# Patient Record
Sex: Male | Born: 1945
Health system: Southern US, Community
[De-identification: ages and names within clinical notes are randomized; demographics above are authoritative.]

## PROBLEM LIST (undated history)

## (undated) DIAGNOSIS — D649 Anemia, unspecified: Secondary | ICD-10-CM

## (undated) DIAGNOSIS — IMO0002 Reserved for concepts with insufficient information to code with codable children: Secondary | ICD-10-CM

## (undated) DIAGNOSIS — I4892 Unspecified atrial flutter: Secondary | ICD-10-CM

## (undated) DIAGNOSIS — I4891 Unspecified atrial fibrillation: Secondary | ICD-10-CM

## (undated) DIAGNOSIS — C259 Malignant neoplasm of pancreas, unspecified: Secondary | ICD-10-CM

## (undated) HISTORY — PX: PORTACATH PLACEMENT: SHX2246

## (undated) HISTORY — PX: BASAL CELL CARCINOMA EXCISION: SHX1214

## (undated) HISTORY — PX: ARM WOUND REPAIR / CLOSURE: SUR1141

## (undated) HISTORY — DX: Reserved for concepts with insufficient information to code with codable children: IMO0002

## (undated) HISTORY — DX: Unspecified atrial flutter: I48.92

## (undated) HISTORY — DX: Unspecified atrial fibrillation: I48.91

## (undated) HISTORY — PX: CERVICAL LAMINECTOMY: SHX94

---

## 2000-11-17 ENCOUNTER — Encounter: Payer: Self-pay | Admitting: Emergency Medicine

## 2000-11-17 ENCOUNTER — Emergency Department (HOSPITAL_COMMUNITY): Admission: AC | Admit: 2000-11-17 | Discharge: 2000-11-17 | Payer: Self-pay

## 2000-12-09 ENCOUNTER — Encounter: Payer: Self-pay | Admitting: Neurological Surgery

## 2000-12-09 ENCOUNTER — Ambulatory Visit (HOSPITAL_COMMUNITY): Admission: RE | Admit: 2000-12-09 | Discharge: 2000-12-09 | Payer: Self-pay | Admitting: Neurological Surgery

## 2005-07-14 ENCOUNTER — Ambulatory Visit: Payer: Self-pay | Admitting: Cardiology

## 2005-07-14 ENCOUNTER — Inpatient Hospital Stay (HOSPITAL_COMMUNITY): Admission: EM | Admit: 2005-07-14 | Discharge: 2005-07-15 | Payer: Self-pay | Admitting: Emergency Medicine

## 2005-07-15 ENCOUNTER — Encounter: Payer: Self-pay | Admitting: Cardiology

## 2005-07-22 ENCOUNTER — Ambulatory Visit: Payer: Self-pay | Admitting: Cardiology

## 2005-07-22 ENCOUNTER — Ambulatory Visit: Payer: Self-pay

## 2005-07-23 ENCOUNTER — Ambulatory Visit: Payer: Self-pay | Admitting: Internal Medicine

## 2005-08-18 HISTORY — PX: ABLATION: SHX5711

## 2005-08-27 ENCOUNTER — Ambulatory Visit: Payer: Self-pay | Admitting: Internal Medicine

## 2005-08-27 ENCOUNTER — Ambulatory Visit (HOSPITAL_COMMUNITY): Admission: RE | Admit: 2005-08-27 | Discharge: 2005-08-27 | Payer: Self-pay | Admitting: Internal Medicine

## 2005-10-12 ENCOUNTER — Ambulatory Visit: Payer: Self-pay | Admitting: Internal Medicine

## 2007-03-11 ENCOUNTER — Ambulatory Visit: Payer: Self-pay | Admitting: Cardiology

## 2007-03-11 ENCOUNTER — Ambulatory Visit (HOSPITAL_COMMUNITY): Admission: RE | Admit: 2007-03-11 | Discharge: 2007-03-11 | Payer: Self-pay | Admitting: Internal Medicine

## 2007-03-24 ENCOUNTER — Ambulatory Visit: Payer: Self-pay | Admitting: Internal Medicine

## 2009-04-19 ENCOUNTER — Ambulatory Visit: Payer: Self-pay | Admitting: Internal Medicine

## 2009-04-19 ENCOUNTER — Encounter: Payer: Self-pay | Admitting: Cardiology

## 2009-04-19 ENCOUNTER — Ambulatory Visit (HOSPITAL_COMMUNITY): Admission: RE | Admit: 2009-04-19 | Discharge: 2009-04-19 | Payer: Self-pay | Admitting: Cardiology

## 2009-04-29 ENCOUNTER — Telehealth: Payer: Self-pay | Admitting: Internal Medicine

## 2009-06-10 ENCOUNTER — Encounter: Admission: RE | Admit: 2009-06-10 | Discharge: 2009-07-09 | Payer: Self-pay | Admitting: Orthopedic Surgery

## 2009-06-11 ENCOUNTER — Telehealth (INDEPENDENT_AMBULATORY_CARE_PROVIDER_SITE_OTHER): Payer: Self-pay | Admitting: *Deleted

## 2010-02-18 ENCOUNTER — Telehealth (INDEPENDENT_AMBULATORY_CARE_PROVIDER_SITE_OTHER): Payer: Self-pay | Admitting: *Deleted

## 2010-04-25 ENCOUNTER — Ambulatory Visit (HOSPITAL_COMMUNITY)
Admission: RE | Admit: 2010-04-25 | Discharge: 2010-04-25 | Payer: Self-pay | Source: Home / Self Care | Attending: Cardiology | Admitting: Cardiology

## 2010-04-25 LAB — CBC
HCT: 46.1 % (ref 39.0–52.0)
Hemoglobin: 16.3 g/dL (ref 13.0–17.0)
MCH: 35.1 pg — ABNORMAL HIGH (ref 26.0–34.0)
MCHC: 35.4 g/dL (ref 30.0–36.0)
MCV: 99.1 fL (ref 78.0–100.0)
Platelets: 196 10*3/uL (ref 150–400)
RBC: 4.65 MIL/uL (ref 4.22–5.81)
RDW: 12.9 % (ref 11.5–15.5)
WBC: 10.2 10*3/uL (ref 4.0–10.5)

## 2010-04-25 LAB — BASIC METABOLIC PANEL
BUN: 9 mg/dL (ref 6–23)
CO2: 23 mEq/L (ref 19–32)
Calcium: 9.6 mg/dL (ref 8.4–10.5)
Chloride: 106 mEq/L (ref 96–112)
Creatinine, Ser: 1.41 mg/dL (ref 0.4–1.5)
GFR calc Af Amer: >60 mL/min (ref >60)
GFR calc non Af Amer: 50 mL/min — ABNORMAL LOW (ref >60)
Glucose, Bld: 106 mg/dL — ABNORMAL HIGH (ref 70–99)
Potassium: 5.2 mEq/L — ABNORMAL HIGH (ref 3.5–5.1)
Sodium: 138 mEq/L (ref 135–145)

## 2010-04-25 LAB — PROTIME-INR
INR: 0.89 (ref 0.00–1.49)
Prothrombin Time: 12.3 seconds (ref 11.6–15.2)

## 2010-04-25 LAB — APTT: aPTT: 27 seconds (ref 24–37)

## 2010-05-20 NOTE — Progress Notes (Signed)
   Phone Note Call from Patient   Caller: Patient Reason for Call: Talk to Doctor Details of Complaint: Prior atrial flutter Summary of Call: Dr. Andrey Campanile and I discussed treatment options for recurrent atrial flutter/fib.  He has had 3 episodes of atypical atrial flutter the past two years.  Most recently, he required DCCV.  He is symptomatic.  I have recommended Flecainide 200 mg when he experiences and episode of sustained atrial flutter.  Also, he will take an additional 25-50 mg of metoprolol if he goes out of rhythm.  Further, he is instructed not to drive or to do anything strenous once he has taken his flecainide.  A prescription has been sent to his pharmacy. Initial call taken by: Laren Boom, MD, [facility],  April 29, 2009 3:12 PM

## 2010-05-20 NOTE — Progress Notes (Signed)
   Recieved request for Records from [facility], forwarded to [facility] for processing [facility]  June 11, 2009 11:42 AM    Appended Document:  Recieved a 2nd request from [facility] forwarded to [facility] .Marland KitchenMarland KitchenKM

## 2010-05-20 NOTE — Progress Notes (Signed)
   Request received from [employer] sent to [facility]. [facility]  February 18, 2010 9:24 AM

## 2010-07-21 LAB — CBC
HCT: 45.8 % (ref 39.0–52.0)
Hemoglobin: 16.1 g/dL (ref 13.0–17.0)
MCHC: 35.1 g/dL (ref 30.0–36.0)
MCV: 102.3 fL — ABNORMAL HIGH (ref 78.0–100.0)
Platelets: 204 10*3/uL (ref 150–400)
RBC: 4.47 MIL/uL (ref 4.22–5.81)
RDW: 12.7 % (ref 11.5–15.5)
WBC: 8.6 10*3/uL (ref 4.0–10.5)

## 2010-07-21 LAB — BASIC METABOLIC PANEL
BUN: 14 mg/dL (ref 6–23)
CO2: 26 mEq/L (ref 19–32)
Calcium: 9.1 mg/dL (ref 8.4–10.5)
Chloride: 106 mEq/L (ref 96–112)
Creatinine, Ser: 1.09 mg/dL (ref 0.4–1.5)
GFR calc Af Amer: >60 mL/min (ref >60)
GFR calc non Af Amer: >60 mL/min (ref >60)
Glucose, Bld: 125 mg/dL — ABNORMAL HIGH (ref 70–99)
Potassium: 4.5 mEq/L (ref 3.5–5.1)
Sodium: 139 mEq/L (ref 135–145)

## 2010-07-21 LAB — PROTIME-INR
INR: 0.88 (ref 0.00–1.49)
Prothrombin Time: 11.9 seconds (ref 11.6–15.2)

## 2010-07-21 LAB — DIGOXIN LEVEL: Digoxin Level: 1 ng/mL (ref 0.8–2.0)

## 2010-09-02 NOTE — Consult Note (Signed)
NAME:  CHUKWUEMEKA, ARTOLA NO.:  [id number]   MEDICAL RECORD NO.:  [id number]          PATIENT TYPE:  OIB   LOCATION:  2899                         FACILITY:  [facility]   PHYSICIAN:  Rollene Rotunda, MD, FACCDATE OF BIRTH:  1946/02/10   DATE OF CONSULTATION:  03/11/2007  DATE OF DISCHARGE:                                 CONSULTATION   PRIMARY CARDIOLOGIST:  Doylene Canning. Ladona Ridgel, MD.   PRIMARY CARE Breindel Collier:  Eugenio Hoes. Tawanna Cooler, MD.   PATIENT PROFILE:  A 65 year old Caucasian male with prior history of  atrial flutter status post RF ablation in May 2007 who presents with  recurrent atrial tachycardia.   PROBLEM:  1. History of atrial flutter.      a.     March 2007 status post DC CV.      b.     History of normal 2-D echocardiogram and exercise Myoview.      c.     Aug 27, 2005 status post RF ablation by Dr. Ladona Ridgel.  2. Hyperlipidemia  3. History of cervical disk disease, status post laminectomy   HISTORY OF PRESENT ILLNESS:  A 65 year old Caucasian male with prior  history of atrial flutter status post cardioversion and subsequent  radiofrequency ablation in May 2007.  Approximately 1 month following  his ablation, he did note palpitations on 2 occasions requiring p.r.n.  Lopressor.  He otherwise has not had any recurrence of palpitations.  Unfortunately this morning he woke up sometime between 2:00 and 3:00  a.m. with tachy palpitations.  There was no chest pain, shortness of  breath, PND, orthopnea, dizziness, syncope.  He is an employee of [employer] and came in to work and continued to have tachy palpitations.  At  that point he contacted [facility] and following ECG the patient  was placed in short-stay for cardioversion.  Currently he denies any  chest pain or shortness of breath and continues to note tachy  palpitations.  His ECG has been reviewed by Dr. Antoine Poche and Dr. Graciela Husbands  and shows atrial tachycardia with a ventricular rate of 110 beats  per   minute.   ALLERGIES:  No known drug allergies.   HOME MEDICATIONS:  Lopressor 200 mg p.r.n. persistent tachy  palpitations, Lipitor 20 mg daily, aspirin 325 mg daily.   FAMILY HISTORY:  Positive for coronary artery disease in his sister, who  had a PCI.  His father also had an MI.   SOCIAL HISTORY:  He does not smoke but will occasionally have an  alcoholic beverage.   REVIEW OF SYSTEMS:  Positive for tachy palpitations.  Otherwise all  systems reviewed and negative.   PHYSICAL EXAMINATION:  GENERAL:  A pleasant white male in no acute  distress, awake, alert and oriented x3.  NECK:  No bruits, no JVD.  LUNGS:  Respirations are unlabored, clear to auscultation.  CARDIAC:  Regular tachycardic S1,S2.  No S3, S4 or murmurs.  ABDOMEN:  Round, soft, nontender, nondistended.  Bowel sounds present x 4.  EXTREMITIES:  Warm, dry, pink.  No clubbing, cyanosis or edema.  Dorsalis pedis, posterior tibial  pulses 2+ and equal bilaterally.   ACCESSORY CLINICAL FINDINGS:  CBC, BMET and TSH are pending, and EKG  shows atrial tachycardia at a rate of 110 beats per minute.  There are  no acute ST/T changes.   ASSESSMENT/PLAN:  1. Atrial tachycardia.  The patient presents with recurrent tachy      palpitations and ECG suggests atrial tachycardia.  He does have      history of A flutter status post ablation in 2007.  EKGs have been      reviewed by Dr. Graciela Husbands, who has recommended DC cardioversion.  The      patient has been brought over to short stay, and currently we are      awaiting lab work prior to pursuing cardioversion.  The patient had      previously been prescribed Toprol XL 25 mg daily; however, he has      not taken this in a long time as he had been doing well post      ablation.  He does have some p.r.n. Lopressor (200-mg tablets)      which again he has not needed to take in a long time.  Likely plan      to initiate daily beta blocker therapy.  Continue aspirin.  2. Hyperlipidemia.   Take Lipitor at home although he says he has not      taken this in a while.  Plan to continue.      Nicolasa Ducking, ANP      Rollene Rotunda, MD, [facility]  Electronically Signed    CB/MEDQ  D:  03/11/2007  T:  03/11/2007  Job:  [id number]

## 2010-09-02 NOTE — Assessment & Plan Note (Signed)
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  He is a very pleasant middle-  aged man with a history of atrial flutter (typical) who is status post  catheter ablation.  The patient developed recurrent atypical atrial  flutter approximately 2 weeks ago and ultimately because of the brief  duration underwent DC cardioversion.  He returns today for follow-up.  He has had no significant tachypalpitations since the cardioversion.  He  denies chest pain.  He denies shortness of breath.  He states that he  played tennis yesterday and had no trouble with this.  He is, however,  concerned about long-term ramifications and the development of  recurrence of atrial flutter.  The patient has a history of  dyslipidemia.  There may be a history of borderline hypertension.  He  has had documented atrial arrhythmias in the past including typical  atrial flutter back in March 2007, for which he underwent catheter  ablation of this.   PHYSICAL EXAM TODAY:  He is a pleasant, well-appearing middle-aged man  in no acute distress.  Blood pressure was 127/76 with a pulse of 72 and regular, respirations  were 18.  The weight was 217 pounds.  NECK:  No jugular venous distention.  LUNGS:  Clear bilaterally to auscultation.  No wheezes, rales or rhonchi  are present.  CARDIOVASCULAR:  A regular rate and rhythm, normal S1 and S2.  EXTREMITIES:  No edema.   No EKG was done today.   IMPRESSION:  1. Recurrent atypical atrial flutter.  2. History of typical flutter, status post ablation   DISCUSSION:  Overall, Dr. Andrey Campanile is stable.  His recurrent atrial  flutter does not appear to be typical flutter but with a different QRS  axis than previously.  We discussed the treatment  options today.  One  would be a period of watchful waiting and a plan to proceed with flutter  ablation if he develops recurrent flutter.  The second would be to  proceed with ablation of flutter.  After discussing the risks, benefits,  goals and expectations of the procedure, he would like to consider it  and will call us if and when he decides to proceed with ablation.     Doylene Canning. Ladona Ridgel, MD  Electronically Signed    GWT/MedQ  DD: 03/24/2007  DT: 03/25/2007  Job #: [id number]

## 2010-09-02 NOTE — Op Note (Signed)
NAME:  Jeremy Moody, Jeremy Moody NO.:  [id number]   MEDICAL RECORD NO.:  [id number]          PATIENT TYPE:  OIB   LOCATION:  2899                         FACILITY:  [facility]   PHYSICIAN:  Rollene Rotunda, MD, FACCDATE OF BIRTH:  03-Jun-1945   DATE OF PROCEDURE:  03/11/2007  DATE OF DISCHARGE:                               OPERATIVE REPORT   CARDIOLOGIST:  Duke Salvia, MD.   PROCEDURE:  Direct current cardioversion.   INDICATION:  Evaluate patient with atrial tachycardia with rapid  ventricular rate, symptomatic.   PROCEDURAL NOTE:  The patient was symptomatic with atrial tachycardia  that developed last night.  He was n.p.o. after midnight.  He had taken  some digoxin and had also taken his Lopressor, Lipitor and aspirin.  He  signed appropriate informed consent.  Anesthesia was administered by Dr.  Katrinka Blazing with 150 mg of Pentothal.  The patient was monitored and had face-  mask oxygen delivered.  He was hemodynamically stable and oxygenating  well throughout the procedure.  He was cardioverted x1 with 100 joules  of synchronized biphasic energy.  This resulted in sinus rhythm.  There  were no apparent complications.  EKG demonstrates sinus rhythm, rate 68,  axis within normal limits, intervals within normal limits, no acute ST-T  wave changes.   PLAN:  The patient will be seen in follow-up by Dr. Graciela Husbands or Dr. Ladona Ridgel  for further management.      Rollene Rotunda, MD, [facility]  Electronically Signed     JH/MEDQ  D:  03/11/2007  T:  03/12/2007  Job:  [id number]

## 2010-09-05 NOTE — H&P (Signed)
NAME:  Jeremy Moody, Jeremy Moody NO.:  [id number]   MEDICAL RECORD NO.:  [id number]          PATIENT TYPE:  OIB   LOCATION:  2899                         FACILITY:  [facility]   PHYSICIAN:  Jeremy Moody, M.D.  DATE OF BIRTH:  1945-06-12   DATE OF ADMISSION:  08/27/2005  DATE OF DISCHARGE:                                HISTORY & PHYSICAL   PRIMARY CARDIOLOGIST:  Dr. Lewayne Moody   REASON FOR ADMISSION:  Jeremy Moody is a 65 year old male recently  hospitalized here at [facility] (March 27 through March 28) with new onset  atrial flutter which was successfully cardioverted after application of 150  joules to normal sinus rhythm.  He presented with atrial flutter of less  than 48-hour duration and was thus discharged on full-dose aspirin rather  than Coumadin.  A 2-D echocardiogram was normal and electrolytes and TSH  level normal as well.   Following discharge patient followed up with Dr. Lewayne Moody and underwent  a repeat stress test (reportedly normal).  He had otherwise been doing well  since his recent discharge save for two episodes of recurrent palpitations,  the most recent occurring earlier this morning.   Patient awoke with palpitations early this morning and despite taking 100 mg  of Toprol and 0.25 mg of digoxin continued to have persistent palpitations.  He thus contacted Dr. Lewayne Moody and arrangements were made to proceed  with same day radiofrequency catheter ablation.   On presentation patient is hemodynamically stable and asymptomatic.  Electrocardiogram reveals atrial flutter (2:1) at 130 BPM.   ALLERGIES:  No known drug allergies.   HOME MEDICATIONS:  1.  Coated aspirin 325 mg daily.  2.  Toprol XL 25 mg daily.  3.  Lipitor 20 mg daily.   PAST MEDICAL HISTORY:  1.  Atrial flutter.      1.  Status post successful DC cardioversion to normal sinus rhythm July 15, 2005.  2.  Normal left ventricular function.      1.  2-D echocardiogram  March 2007.  3.  History of negative exercise stress test.  4.  Hyperlipidemia.  5.  Status post cervical neck surgery.   SOCIAL HISTORY:  Patient denies smoking tobacco.  Drinks alcohol on  occasion.   FAMILY HISTORY:  Father had history of myocardial infarction.  Sister with  known coronary artery disease status post percutaneous intervention.   REVIEW OF SYSTEMS:  Negative for hypertension, diabetes mellitus.  Denies  any exertional chest discomfort or dyspnea.  Denies any evidence of overt  bleeding.  Remaining systems negative.   PHYSICAL EXAMINATION:  VITAL SIGNS:  Blood pressure 116/78, pulse 129,  regular, respirations 18, temperature 97.4.  GENERAL:  65 year old male sitting upright in no apparent distress.  HEENT:  Normocephalic, atraumatic.  NECK:  Preserved bilateral carotid pulses without bruits.  No JVD.  LUNGS:  Clear to auscultation all fields.  HEART:  Regular rhythm with increased rate (S1, S2).  No significant  murmurs.  ABDOMEN:  Soft, nontender with intact bowel sounds.  No bruits.  EXTREMITIES:  Palpable bilateral femoral pulses without bruits; palpable  dorsalis pedis/posterior tibialis pulses without edema.  NEUROLOGIC:  No focal deficits.   Electrocardiogram:  Atrial flutter (2:1) at 130 BPM with left axis  deviation; no acute changes.   LABORATORY DATA:  Pending.  WBC 7.8, hemoglobin 16.9, hematocrit 48 (MCV  100.6), platelets 221.   IMPRESSION:  1.  Recurrent atrial flutter with rapid ventricular response.      1.  Status post successful DC cardioversion to normal sinus rhythm March          2007.  2.  Normal left ventricular function.  3.  Hyperlipidemia.   PLAN:  Proceed with radiofrequency catheter ablation per Dr. Lewayne Moody.      Jeremy Moody, P.A. [facility]    ______________________________  Jeremy Moody, M.D.    GS/MEDQ  D:  08/27/2005  T:  08/27/2005  Job:  [id number]   cc:   Jeremy Moody, M.D. [facility]  [address] [place]  [place] [address]

## 2010-09-05 NOTE — Op Note (Signed)
NAME:  Jeremy Moody, Jeremy Moody NO.:  [id number]   MEDICAL RECORD NO.:  [id number]          PATIENT TYPE:  OIB   LOCATION:  2001                         FACILITY:  [facility]   PHYSICIAN:  Doylene Canning. Ladona Ridgel, M.D.  DATE OF BIRTH:  Dec 14, 1945   DATE OF PROCEDURE:  08/27/2005  DATE OF DISCHARGE:  08/27/2005                                 OPERATIVE REPORT   PROCEDURE PERFORMED:  Electrophysiologic study and catheter ablation of  atrial flutter.   INTRODUCTION:  The patient is a very pleasant 65 year old male with a  history of recurrent atrial flutter initially diagnosed back in March when  he presented to hospital with tachy palpitations and was found to be in  atrial flutter at rates of 150 beats a minute.  He also underwent a DC  cardioversion and aspirin therapy along with beta blockers.  The patient did  well for approximately 6 weeks but then developed recurrent atrial flutter  and is here day for electrophysiologic study and catheter ablation.  Of  note, the patient's atrial flutter has been present for less than 12 hours  having awakened him from sleep. He is certain that he did not have flutter  yesterday.   DESCRIPTION OF PROCEDURE:  After informed consent was obtained, the patient  was taken to the diagnostic EP lab in the fasted state.  After the usual  preparation and draping, intravenous fentanyl and midazolam that was given  for sedation.  A 6-French hexapolar catheter was inserted percutaneously in  the right jugular vein advanced to the coronary sinus.  A 7-French 20 Polar  Halo catheter was inserted percutaneously in the right femoral vein and  advanced to the right atrium.  A 5-French quadripolar catheter was inserted  percutaneously in the right femoral vein and advanced to the  His bundle  region.  After measuring the basic intervals, mapping was carried out  demonstrating typical counterclockwise tricuspid annular reentrant and  atrial flutter.  The 7-French  quadripolar ablation catheter was inserted  percutaneously through the right femoral vein and advanced to the right  atrium.  Mapping was carried out.  The atrial flutter isthmus was normal  size and orientation.  During the second RF energy application, atrial  flutter was terminated and sinus rhythm was restored.  The next RF energy  application resulted in the creation of bidirectional isthmus block.  A  bonus RF energy application was then delivered and the patient was observed  for 25 minutes.  During this time, rapid ventricular pacing was carried out  from the RV apex demonstrating a VA Wenckebach cycle length of greater than  or equal to 600 msec. A programmed ventricular stimulation was carried out  and also demonstrated VA dissociation at 600 msec.  Rapid atrial pacing was  carried out at pacing cycle length of 490 msec, stepwise decreased down to  300 msec where AV Wenckebach was observed.  Programmed atrial stimulation  was carried out from the coronary sinus as well as the high right atrium  with a __________ cycle length of 500 msec. The S1, S2 interval was stepwise  decreased down to 240 msec where AV node ERP was observed.  After the  patient was observed for approximately 25 minutes, isthmus conduction  promptly returned.  The 7-French quadripolar ablation catheter was then  reinserted into the atrial flutter isthmus.  A large area of atrial  myocardium was found on the anterior portion of the atrial flutter isthmus  just anterior to the coronary sinus ostium.  A total of seven additional RF  energy applications were delivered in this region resulting in the creation  of isthmus block.  The patient was then again observed for 15 minutes and  had no recurrent atrial flutter or isthmus conduction.  The catheter was  then removed, hemostasis was assured and the patient was returned to his  room in satisfactory condition.   COMPLICATIONS:  There were no immediate procedure  complications.   RESULTS:  A.  Baseline ECG:  Baseline ECG demonstrates atrial flutter with  2:1 AV conduction and an atrial flutter cycle length of 240 msec.  B.  Baseline intervals:  The HV interval was 53 msec. The atrial flutter  cycle length was 240 msec, the QRS duration 71 msec.  C.  Rapid ventricular pacing:  Rapid ventricular pacing following catheter  ablation demonstrated VA dissociation at 600 msec.  D.  Programmed ventricular stimulation:  Programmed ventricular stimulation  was carried out following restoration of sinus rhythm and demonstrated VA  dissociation at 600 msec.  E.  Rapid atrial pacing.  Rapid atrial pacing was carried out from the high  right atrium and coronary sinus at pacing cycle length of 600 msec and  stepwise decreased down to 300 msec where AV Wenckebach was observed.  During rapid atrial pacing following ablation, there was no inducible  arrhythmia.  F.  Programmed atrial stimulation:  Programmed atrial stimulation was  carried out from the coronary sinus at a pacing cycle length of 500 msec  with the S1, S2 interval stepwise decreased down to 240 msec where the AV  node ERP was observed.  During programmed atrial stimulation, there were no  age jumps and echo beats.  G.  Arrhythmias observed:  1.  Atrial flutter initiation present at the time of EP study.  Duration was      sustained, termination was catheter ablation cycle length of 240 msec.      1.  Mapping:  Mapping of the atrial flutter demonstrated typical          counterclockwise tricuspid annular reentrant atrial flutter.          1.  RF energy application:  A total of 18 RF energy applications              were delivered.  During the third RF energy application, sinus              rhythm was restored and isthmus block was demonstrated.  Eight              bonus RF energy applications were delivered.  There was no             recurrent isthmus conduction for 25 minutes but then the patient               developed recurrent atrial flutter isthmus conduction.  Seven              additional RF energy applications were delivered to the anterior              portion of  the atrial flutter isthmus resulting in demonstration              of bidirectional block and atrial flutter isthmus.   CONCLUSION:  This study demonstrates successful electrophysiologic study and  RF catheter ablation of typical atrial flutter with a total of 18 RF energy  applications some of which were very short delivered to the usual atrial  flutter isthmus resulting in termination of atrial flutter, restoration of  sinus rhythm, a creation of bidirectional block and atrial flutter isthmus.           ______________________________  Doylene Canning. Ladona Ridgel, M.D.     GWT/MEDQ  D:  08/27/2005  T:  08/28/2005  Job:  [id number]   cc:   Olga Millers, M.D. [facility]  [address]  [address]  [place]  [place] [address]   Eugenio Hoes. Tawanna Cooler, M.D. [facility]  [address] [place]  [place] [address]

## 2010-09-05 NOTE — Discharge Summary (Signed)
NAME:  Jeremy Moody, Jeremy Moody NO.:  [id number]   MEDICAL RECORD NO.:  [id number]          PATIENT TYPE:  OIB   LOCATION:  2001                         FACILITY:  [facility]   PHYSICIAN:  Doylene Canning. Ladona Ridgel, M.D.  DATE OF BIRTH:  03/01/1946   DATE OF ADMISSION:  08/27/2005  DATE OF DISCHARGE:  08/27/2005                                 DISCHARGE SUMMARY   PRIMARY CARE PHYSICIAN:  Tinnie Gens A. Tawanna Cooler, M.D. [facility].   PRIMARY CARDIOLOGIST:  Doylene Canning. Ladona Ridgel, M.D.   PRINCIPAL DIAGNOSIS:  Atrial flutter.   OTHER DIAGNOSES:  1.  Hypertension.  2.  Hyperlipidemia.   ALLERGIES:  No known drug allergies.   PROCEDURE:  Atrial flutter radiofrequency ablation.   HISTORY OF PRESENT ILLNESS:  A 65 year old white male with a prior history  of atrial flutter, first diagnosed in March 2007.  At that time he underwent  successful DCCV and had a normal 2D echocardiogram with normal followup  exercise Myoview.  He was in his usual state of health until this morning,  when he awoke and had recurrent palpitations similar to previous episodes of  atrial flutter.  He contacted Dr. Ladona Ridgel and arrangements were made for  admission with same day radiofrequency ablation.   HOSPITAL COURSE:  Jeremy Moody was taken to the electrophysiology lab where he  underwent successful radiofrequency ablation.  He tolerated this procedure  well and post procedure has been ambulating without any access site  issues/bleeding or recurrence in atrial flutter.  He is being discharged  home this evening in satisfactory condition.   DISCHARGE LABORATORY:  Hemoglobin 16.9, hematocrit 48, WBC 7.8, platelets  221.   DISPOSITION:  The patient is being discharged home today in good condition.   FOLLOWUP PLANS AND APPOINTMENTS:  He will have a followup appointment with  Dr. Lewayne Bunting in about three to four weeks.   DISCHARGE MEDICATIONS:  1.  Toprol XL 25 mg every day.  2.  Lipitor 20 mg every day.  3.  Aspirin 325 mg every  day.   OUTSTANDING LABORATORY/STUDIES:  None.   Duration of discharge encounter:  Thirty five minutes.      Ok Anis, NP    ______________________________  Doylene Canning. Ladona Ridgel, M.D.    CRB/MEDQ  D:  08/27/2005  T:  08/28/2005  Job:  [id number]   cc:   Tinnie Gens A. Tawanna Cooler, M.D. [facility]  [address] [place]  [place] [address]

## 2010-09-05 NOTE — Discharge Summary (Signed)
NAME:  Jeremy Moody, Jeremy Moody NO.:  [id number]   MEDICAL RECORD NO.:  [id number]          PATIENT TYPE:  INP   LOCATION:  2002                         FACILITY:  [facility]   PHYSICIAN:  Olga Millers, M.D. LHCDATE OF BIRTH:  04-11-1946   DATE OF ADMISSION:  07/14/2005  DATE OF DISCHARGE:  07/15/2005                                 DISCHARGE SUMMARY   PRINCIPAL DIAGNOSIS:  1.  Atrial flutter.  2.  Hyperlipidemia.   ALLERGIES:  No known drug allergies.   PROCEDURES:  1.  Two-dimensional echocardiogram.  2.  Synchronized cardioversion.   HISTORY OF PRESENT ILLNESS:  [age]-year-old white male with prior history of  hyperlipidemia, who otherwise has no prior cardiac history other than  occasional palpitations with negative evaluation in the past including an  exercise Myoview.  Typically, he does not have dyspnea on exertion,  orthopnea, PND, pedal edema or exertional chest pain.  He was at the  [employer] [facility] [employer] Conference in [place] on the morning of  July 14, 2005, when he had onset of palpitations at 5 a.m.  without  associated symptoms.  He took a total of 300 mg of Lopressor over the course  of the day while he traveled back to [place].  He then drove himself  from [place], [place] to the [facility] ED for evaluation.  In the  ED, he was noted to be in atrial flutter and he was admitted for further  evaluation.   HOSPITAL COURSE:  He was given a diltiazem bolus followed by an infusion of  5 mg an hour.  His pressure did drop into the 80s systolically with the  diltiazem bolus, requiring some fluid hydration.  His cardiac markers were  negative x3 and his electrolytes were normal.  TSH was also normal.  Because  he was in flutter for less than 48 hours, decision was made to pursue  synchronized cardioversion on the morning of July 15, 2005 and this was  successful in restoring sinus rhythm.  He will be discharged on Toprol-XL 25  mg daily as well as aspirin.  There is not a need for Coumadin at this time.  He underwent 2-D echocardiogram this afternoon and at the time of this  dictation, those results are pending.  He has a followup appointment  scheduled with Dr. Lewayne Bunting on July 22, 2005 and is also scheduled for  followup exercise Myoview on July 22, 2005.   DISCHARGE LABORATORY DATA:  Hemoglobin 14.8, hematocrit 42.7, WBC 10.8,  platelets 192,000, MCV 101.9.  Sodium 140, potassium 4.1, chloride 110, CO2  24, BUN 12, creatinine 1.1, glucose 101, total bilirubin 1.0, alkaline  phosphatase 58, AST 31, ALT 28, albumin 4.2.  Cardiac markers negative x3.  Calcium 9.4.  TSH 2.317.   DISPOSITION:  The patient is being discharged home today in good condition.   FOLLOWUP PLANS AND APPOINTMENTS:  He is to follow up with Dr. Lewayne Bunting  on July 23, 2005 at 4:45 p.m.  Prior to that, he will undergo exercise  Myoview on July 22, 2005 at 9:15 a.m.  He will also have a B12 and folate  checked as an outpatient, given his elevated MCV.   DISCHARGE MEDICATIONS:  1.  Aspirin 325 mg daily.  2.  Toprol-XL 25 mg daily.  3.  Lipitor 20 mg daily.   OUTSTANDING LABORATORY STUDIES:  None.   DURATION OF DISCHARGE ENCOUNTER:  Sixty minutes including physician time.      Ok Anis, NP    ______________________________  Olga Millers, M.D. [facility]    CRB/MEDQ  D:  07/15/2005  T:  07/17/2005  Job:  [id number]   cc:   Doylene Canning. Ladona Ridgel, M.D.  [address]  [address]  [place]  [place] [address]

## 2010-09-05 NOTE — H&P (Signed)
NAME:  RILEY, HALLUM NO.:  [id number]   MEDICAL RECORD NO.:  [id number]          PATIENT TYPE:  EMS   LOCATION:  MAJO                         FACILITY:  [facility]   PHYSICIAN:  Olga Millers, M.D. 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  He has no prior cardiac  history of palpitations.  He did have an evaluation with a Holter monitor  previously that was unremarkable by his report and a negative nuclear study  approximately eight years ago.  He typically does not have dyspnea on  exertion, orthopnea, PND, pedal edema, presyncope, syncope, or exertional  chest pain.  He has had occasional palpitations in the past.  He was at the  heart meetings in [place] and awoke this morning at approximately 5 a.m.  with palpitations.  There was no associated chest pain or shortness of  breath.  He took 100 mg of Lopressor x3 over the course of the day but his  symptoms persisted.  He now presents to the emergency room and is found to  be in atrial flutter.  He has no known drug allergies.   HOME MEDICATIONS:  1.  Aspirin daily.  2.  Motrin p.r.n. neck pain.  3.  Lipitor 20 mg p.o. daily.   SOCIAL HISTORY:  He does not smoke but he does occasionally consume alcohol  (approximately one to two alcoholic beverages per evening).   FAMILY HISTORY:  Positive for coronary artery disease in his sister who had  a recent PCI.  He also states that his father had a myocardial infarction.   PAST MEDICAL HISTORY:  There is no diabetes mellitus nor hypertension but  there is a history of hyperlipidemia.  He has had prior cervical disk  surgery.   REVIEW OF SYSTEMS:  He denies any headaches or fevers or chills.  There is  no productive cough or hemoptysis.  There is no dysphagia  or odynophagia,  melena, or hematochezia.  There is no dysuria or hematuria.  There is no  rash or seizure activity.  There is no orthopnea, PND, or pedal edema.  The  remaining systems are negative.   PHYSICAL EXAMINATION:  VITAL SIGNS:  Blood pressure 106/70 and his pulse is  130.  GENERAL:  He is well-developed and well-nourished in no acute distress.  He  does not appear to be depressed.  SKIN:  Warm and dry.  PERIPHERAL:  No clubbing.  HEENT:  Unremarkable with normal eyelids.  NECK:  Supple with normal upstroke bilaterally.  There are no bruits noted.  There is no jugular venous distention and I cannot appreciate thyromegaly.  CHEST:  Clear to auscultation.  Normal expansion.  CARDIOVASCULAR:  Tachycardic rate, but a regular rhythm.  There are no  murmurs, rubs, or gallops noted.  ABDOMEN:  Nontender.  Standard positive bowel sounds.  No  hepatosplenomegaly.  No mass appreciated.  There  is no abdominal bruit.  He  has 2+ femoral pulses bilaterally.  No bruits.  EXTREMITIES:  No edema.  I can palpate no cords.  He has 2+ dorsalis pedis  pulses bilaterally.  NEUROLOGIC:  Grossly intact.   His electrocardiogram shows atrial flutter with a rapid ventricular response  at 132.  There is an RV conduction delay.  There are no significant ST  changes noted.  Remaining laboratories are pending.   DIAGNOSES:  1.  Atrial flutter with a rapid ventricular response.  2.  Hyperlipidemia.   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  We will admit to telemetry and cycle enzymes, although I doubt  ischemia.  We will check a TSH as well as an echocardiogram to quantify his  left ventricular function.  I will begin intravenous heparin as well as IV  Cardizem for rate control.  If he does not convert overnight then we will  plan to cardiovert tomorrow morning (the duration of his atrial flutter is  less than 48 hours).  He will then need an EP evaluation  for possible  flutter ablation.  He will not need Coumadin long-term as he does not have  embolic risk factors (he does not have a history of diabetes mellitus,  hypertension, prior CVA, or history of reduced LV function).  We will plan  an outpatient stress Myoview after his atrial flutter is treated.           ______________________________  Olga Millers, M.D. [facility]     BC/MEDQ  D:  07/14/2005  T:  07/15/2005  Job:  [id number]

## 2010-09-05 NOTE — Op Note (Signed)
NAME:  Jeremy Moody, Jeremy Moody NO.:  [id number]   MEDICAL RECORD NO.:  [id number]          PATIENT TYPE:  INP   LOCATION:  2002                         FACILITY:  [facility]   PHYSICIAN:  Olga Millers, M.D. LHCDATE OF BIRTH:  [birth year]   DATE OF PROCEDURE:  DATE OF DISCHARGE:                                 OPERATIVE REPORT   PROCEDURE PERFORMED:  Cardioversion of atrial flutter.   The patient was sedated with 250 mg of Pentothal intravenously.  Synchronized cardioversion with 150 joules (biphasic) resulted in normal  sinus rhythm.  There were no immediate complications.           ______________________________  Olga Millers, M.D. [facility]     BC/MEDQ  D:  07/15/2005  T:  07/16/2005  Job:  [id number]

## 2011-01-27 LAB — APTT: aPTT: 27

## 2011-01-27 LAB — CBC
HCT: 49.6
Hemoglobin: 17.2 — ABNORMAL HIGH
MCHC: 34.7
MCV: 100.5 — ABNORMAL HIGH
Platelets: 272
RBC: 4.93
RDW: 13.2
WBC: 10.1

## 2011-01-27 LAB — BASIC METABOLIC PANEL
BUN: 13
CO2: 27
Calcium: 9.3
Chloride: 105
Creatinine, Ser: 0.96
GFR calc Af Amer: >60
GFR calc non Af Amer: >60
Glucose, Bld: 110 — ABNORMAL HIGH
Potassium: 4.3
Sodium: 139

## 2011-01-27 LAB — TSH: TSH: 1.634

## 2011-01-27 LAB — PROTIME-INR
INR: 0.9
Prothrombin Time: 12.1

## 2011-07-01 ENCOUNTER — Other Ambulatory Visit (HOSPITAL_COMMUNITY): Payer: Self-pay

## 2011-07-01 ENCOUNTER — Other Ambulatory Visit: Payer: Self-pay

## 2011-07-07 ENCOUNTER — Other Ambulatory Visit: Payer: Self-pay | Admitting: *Deleted

## 2011-07-07 DIAGNOSIS — R002 Palpitations: Secondary | ICD-10-CM

## 2011-07-29 ENCOUNTER — Other Ambulatory Visit: Payer: Self-pay

## 2011-07-29 NOTE — Telephone Encounter (Signed)
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 Pt stated he tried to do refill himself in system and it failed it would not allow him to complete process. I took phone message and I would have message entered to staff for refills and would contact him back at office. I called to see what pharmacy he would like refill to go to & also to get approval from Dr. Ladona Ridgel. Pt said he saw Dr. Ladona Ridgel and that he told him it was not a problem to get his refills. Pt has not been seen in office for a office visit, Pt has a order in system for a 48 hr monitor, pt has not completed as well.All visit have been in hospital services not the office. Will forward information to Dr. Ladona Ridgel to see what he would like to to at this point.

## 2011-07-30 ENCOUNTER — Other Ambulatory Visit: Payer: Self-pay

## 2011-07-30 MED ORDER — FLECAINIDE ACETATE 100 MG PO TABS: 100.0000 mg | ORAL_TABLET | Freq: Two times a day (BID) | ORAL | Status: DC

## 2011-08-31 ENCOUNTER — Other Ambulatory Visit: Payer: Self-pay | Admitting: *Deleted

## 2011-08-31 MED ORDER — METOPROLOL TARTRATE 25 MG PO TABS: ORAL_TABLET | ORAL | Status: DC

## 2012-03-03 ENCOUNTER — Other Ambulatory Visit: Payer: Self-pay | Admitting: Internal Medicine

## 2012-03-03 MED ORDER — METOPROLOL TARTRATE 25 MG PO TABS: ORAL_TABLET | ORAL | Status: DC

## 2012-03-03 NOTE — Telephone Encounter (Signed)
New problem:   New rx for crestor. C/o lipitor is given him joint pain.

## 2012-03-03 NOTE — Telephone Encounter (Signed)
Called the Metoprolol in to the pharmacy, will have to check with Dr Ladona Ridgel in regards to the Crestor

## 2012-04-06 ENCOUNTER — Other Ambulatory Visit: Payer: Self-pay | Admitting: Cardiology

## 2012-04-06 MED ORDER — ROSUVASTATIN CALCIUM 10 MG PO TABS: 10.0000 mg | ORAL_TABLET | Freq: Every day | ORAL | Status: DC

## 2012-04-06 NOTE — Telephone Encounter (Signed)
Ok to fill rosuvastatin (CRESTOR) 10 MG tablet -- take 1 tablet by mouth qd QTY: 30 with 2 refills per Dr. Cassell Clement.  Caralee Ates, CMA

## 2012-08-12 ENCOUNTER — Other Ambulatory Visit: Payer: Self-pay | Admitting: Internal Medicine

## 2012-08-12 ENCOUNTER — Telehealth: Payer: Self-pay | Admitting: *Deleted

## 2012-08-12 NOTE — Telephone Encounter (Addendum)
Dr Ladona Ridgel spoke with patient and spoke with pharmacy.  His medication was called in and appointment was made for the end of May

## 2012-08-12 NOTE — Telephone Encounter (Signed)
Pt states he has seen Dr. Ladona Ridgel. And he needs flecainide refilled. I see know recent documents of this except in EMR some Opt notes back in 2010..etc...will route this to Dr. Lubertha Basque nurse for review. [facility] pharmacy

## 2012-09-15 ENCOUNTER — Ambulatory Visit (INDEPENDENT_AMBULATORY_CARE_PROVIDER_SITE_OTHER): Payer: 59 | Admitting: Internal Medicine

## 2012-09-15 ENCOUNTER — Encounter: Payer: Self-pay | Admitting: Internal Medicine

## 2012-09-15 VITALS — BP 128/76 | HR 59 | Ht 71.0 in | Wt 221.6 lb

## 2012-09-15 DIAGNOSIS — I4891 Unspecified atrial fibrillation: Secondary | ICD-10-CM | POA: Insufficient documentation

## 2012-09-15 NOTE — Patient Instructions (Signed)
Your physician wants you to follow-up in: 2 years with Dr. Taylor.  You will receive a reminder letter in the mail two months in advance. If you don't receive a letter, please call our office to schedule the follow-up appointment.  

## 2012-09-15 NOTE — Assessment & Plan Note (Signed)
He is maintaining sinus rhythm on very low-dose flecainide therapy in conjunction with low-dose beta blocker therapy. He will continue his current medical therapy. I've asked the patient to keep a low caffeine and low alcohol intake diet.

## 2012-09-15 NOTE — Progress Notes (Signed)
HPI Jeremy Moody returns today for followup. He is a pleasant 67 yo man with a h/o atrial flutter s/p catheter ablation with a h/o recurrent atrial fibrillation. In the interim he has been stable on low dose flecainide. He denies chest pain, sob, or peripheral edema. Minimal palpitations unless he misses a couple of days of flecainide.   No Known Allergies   Current Outpatient Prescriptions  Medication Sig Dispense Refill  . aspirin 325 MG tablet Take 325 mg by mouth daily.      . fish oil-omega-3 fatty acids 1000 MG capsule Take 1 g by mouth daily.      . flecainide (TAMBOCOR) 100 MG tablet       . metoprolol tartrate (LOPRESSOR) 25 MG tablet Take 25 mg by mouth daily. Take one by mouth twice daily      . Multiple Vitamin (MULTIVITAMIN) tablet Take 1 tablet by mouth daily.      . rosuvastatin (CRESTOR) 10 MG tablet Take 1 tablet (10 mg total) by mouth daily.  30 tablet  2   No current facility-administered medications for this visit.     History reviewed. No pertinent past medical history.  ROS:   All systems reviewed and negative except as noted in the HPI.   History reviewed. No pertinent past surgical history.   No family history on file.   History   Social History  . Marital Status: Married    Spouse Name: N/A    Number of Children: N/A  . Years of Education: N/A   Occupational History  . Not on file.   Social History Main Topics  . Smoking status: Never Smoker   . Smokeless tobacco: Not on file  . Alcohol Use: 3 - 4 oz/week    6-8 drink(s) per week  . Drug Use: No  . Sexually Active: Not on file   Other Topics Concern  . Not on file   Social History Narrative  . No narrative on file     BP 128/76  Pulse 59  Ht 5\' 11"  (1.803 m)  Wt 221 lb 9.6 oz (100.517 kg)  BMI 30.92 kg/m2  Physical Exam:  Well appearing NAD HEENT: Unremarkable Neck:  No JVD, no thyromegally Lymphatics:  No adenopathy Back:  No CVA tenderness Lungs:  Clear with no wheezes,  rales, or rhonchi.  HEART:  Regular rate rhythm, no murmurs, no rubs, no clicks Abd:  soft, positive bowel sounds, no organomegally, no rebound, no guarding Ext:  2 plus pulses, no edema, no cyanosis, no clubbing Skin:  No rashes no nodules Neuro:  CN II through XII intact, motor grossly intact  EKG NSR with borderline LVH  Assess/Plan:

## 2012-10-03 ENCOUNTER — Other Ambulatory Visit: Payer: Self-pay | Admitting: Internal Medicine

## 2013-02-08 ENCOUNTER — Other Ambulatory Visit: Payer: Self-pay | Admitting: Internal Medicine

## 2013-09-15 ENCOUNTER — Ambulatory Visit (AMBULATORY_SURGERY_CENTER): Payer: Self-pay | Admitting: *Deleted

## 2013-09-15 VITALS — Ht 71.0 in | Wt 227.4 lb

## 2013-09-15 DIAGNOSIS — Z1211 Encounter for screening for malignant neoplasm of colon: Secondary | ICD-10-CM

## 2013-09-15 MED ORDER — PREPOPIK 10-3.5-12 MG-GM-GM PO PACK: PACK | ORAL | Status: DC

## 2013-09-15 NOTE — Progress Notes (Signed)
Patient denies any allergies to eggs or soy. Patient denies any problems with anesthesia/sedation. Patient denies any oxygen use at home and does not take any diet/weight loss medications. EMMI education assisgned to patient on colonoscopy, this was explained and instructions given to patient. 

## 2013-09-25 ENCOUNTER — Encounter: Payer: Self-pay | Admitting: Gastroenterology

## 2013-09-25 ENCOUNTER — Ambulatory Visit (AMBULATORY_SURGERY_CENTER): Payer: 59 | Admitting: Gastroenterology

## 2013-09-25 VITALS — BP 121/55 | HR 56 | Temp 97.5°F | Resp 20 | Ht 71.0 in | Wt 227.0 lb

## 2013-09-25 DIAGNOSIS — D126 Benign neoplasm of colon, unspecified: Secondary | ICD-10-CM

## 2013-09-25 DIAGNOSIS — Z1211 Encounter for screening for malignant neoplasm of colon: Secondary | ICD-10-CM

## 2013-09-25 MED ORDER — SODIUM CHLORIDE 0.9 % IV SOLN: 500.0000 mL | INTRAVENOUS | Status: DC

## 2013-09-25 NOTE — Progress Notes (Signed)
Report to PACU, RN, vss, BBS= Clear.  

## 2013-09-25 NOTE — Op Note (Signed)
[place]  [address] [place], [address]   COLONOSCOPY PROCEDURE REPORT  PATIENT: Jeremy Moody, Jeremy Moody  MR#: [id number] BIRTHDATE: 1946/02/26 , 67  yrs. old GENDER: Male ENDOSCOPIST: Ladene Artist, MD, [facility] REFERRED AX:[id number] Delora Fuel, M.D. PROCEDURE DATE:  09/25/2013 PROCEDURE:   Colonoscopy with biopsy First Screening Colonoscopy - Avg.  risk and is 50 yrs.  old or older Yes.  Prior Negative Screening - Now for repeat screening. N/A  History of Adenoma - Now for follow-up colonoscopy & has been > or = to 3 yrs.  N/A  Polyps Removed Today? Yes. ASA CLASS:   Class II INDICATIONS:average risk screening. MEDICATIONS: MAC sedation, administered by CRNA and propofol (Diprivan) 280mg  IV DESCRIPTION OF PROCEDURE:   After the risks benefits and alternatives of the procedure were thoroughly explained, informed consent was obtained.  A digital rectal exam revealed no abnormalities of the rectum.   The [id number]  endoscope was introduced through the anus and advanced to the cecum, which was identified by both the appendix and ileocecal valve. No adverse events experienced.   The quality of the prep was Prepopik good The instrument was then slowly withdrawn as the colon was fully examined.  COLON FINDINGS: A sessile polyp measuring 4 mm in size was found in the transverse colon.  A polypectomy was performed with cold forceps.  The resection was complete and the polyp tissue was completely retrieved.   The colon was otherwise normal.  There was no diverticulosis, inflammation, polyps or cancers unless previously stated.  Retroflexed views revealed no abnormalities. The time to cecum=2 minutes 11 seconds.  Withdrawal time=16 minutes 06 seconds.  The scope was withdrawn and the procedure completed.  COMPLICATIONS: There were no complications.  ENDOSCOPIC IMPRESSION: 1.   Sessile polyp measuring 4 mm in the transverse colon; polypectomy performed with cold  forceps 2.   The colon was otherwise normal  RECOMMENDATIONS: 1.  Await pathology results 2.  Repeat colonoscopy in 5 years if polyp adenomatous; otherwise 10 years  eSigned:  Ladene Artist, MD, [facility] 09/25/2013 9:05 AM

## 2013-09-25 NOTE — Patient Instructions (Signed)
YOU HAD AN ENDOSCOPIC PROCEDURE TODAY AT THE [facility]: Refer to the procedure report that was given to you for any specific questions about what was found during the examination.  If the procedure report does not answer your questions, please call your gastroenterologist to clarify.  If you requested that your care partner not be given the details of your procedure findings, then the procedure report has been included in a sealed envelope for you to review at your convenience later.  YOU SHOULD EXPECT: Some feelings of bloating in the abdomen. Passage of more gas than usual.  Walking can help get rid of the air that was put into your GI tract during the procedure and reduce the bloating. If you had a lower endoscopy (such as a colonoscopy or flexible sigmoidoscopy) you may notice spotting of blood in your stool or on the toilet paper. If you underwent a bowel prep for your procedure, then you may not have a normal bowel movement for a few days.  DIET: Your first meal following the procedure should be a light meal and then it is ok to progress to your normal diet.  A half-sandwich or bowl of soup is an example of a good first meal.  Heavy or fried foods are harder to digest and may make you feel nauseous or bloated.  Likewise meals heavy in dairy and vegetables can cause extra gas to form and this can also increase the bloating.  Drink plenty of fluids but you should avoid alcoholic beverages for 24 hours.  ACTIVITY: Your care partner should take you home directly after the procedure.  You should plan to take it easy, moving slowly for the rest of the day.  You can resume normal activity the day after the procedure however you should NOT DRIVE or use heavy machinery for 24 hours (because of the sedation medicines used during the test).    SYMPTOMS TO REPORT IMMEDIATELY: A gastroenterologist can be reached at any hour.  During normal business hours, 8:30 AM to 5:00 PM Monday through Friday,  call [phone].  After hours and on weekends, please call the GI answering service at [phone] who will take a message and have the physician on call contact you.   Following lower endoscopy (colonoscopy or flexible sigmoidoscopy):  Excessive amounts of blood in the stool  Significant tenderness or worsening of abdominal pains  Swelling of the abdomen that is new, acute  Fever of 100F or higher    FOLLOW UP: If any biopsies were taken you will be contacted by phone or by letter within the next 1-3 weeks.  Call your gastroenterologist if you have not heard about the biopsies in 3 weeks.  Our staff will call the home number listed on your records the next business day following your procedure to check on you and address any questions or concerns that you may have at that time regarding the information given to you following your procedure. This is a courtesy call and so if there is no answer at the home number and we have not heard from you through the emergency physician on call, we will assume that you have returned to your regular daily activities without incident.  SIGNATURES/CONFIDENTIALITY: You and/or your care partner have signed paperwork which will be entered into your electronic medical record.  These signatures attest to the fact that that the information above on your After Visit Summary has been reviewed and is understood.  Full responsibility of the confidentiality   of this discharge information lies with you and/or your care-partner.     

## 2013-09-25 NOTE — Progress Notes (Signed)
Called to room to assist during endoscopic procedure.  Patient ID and intended procedure confirmed with present staff. Received instructions for my participation in the procedure from the performing physician.  

## 2013-09-26 ENCOUNTER — Telehealth: Payer: Self-pay | Admitting: *Deleted

## 2013-09-26 NOTE — Telephone Encounter (Signed)
Voicemail not set up unable to leave message attempted to contact pt. x2.

## 2013-09-28 ENCOUNTER — Encounter: Payer: Self-pay | Admitting: Gastroenterology

## 2013-12-05 ENCOUNTER — Other Ambulatory Visit: Payer: Self-pay | Admitting: Internal Medicine

## 2013-12-06 NOTE — Telephone Encounter (Signed)
Should this be qd or bid? Please advise. Thanks, MI 

## 2013-12-07 NOTE — Telephone Encounter (Signed)
Medication list has it as bid and no changes were made at last office visit

## 2014-02-26 ENCOUNTER — Other Ambulatory Visit: Payer: Self-pay | Admitting: Internal Medicine

## 2014-06-19 ENCOUNTER — Other Ambulatory Visit: Payer: Self-pay | Admitting: Dermatology

## 2014-07-10 ENCOUNTER — Other Ambulatory Visit: Payer: Self-pay | Admitting: Dermatology

## 2014-08-29 ENCOUNTER — Other Ambulatory Visit: Payer: Self-pay | Admitting: *Deleted

## 2014-08-29 MED ORDER — DOXYCYCLINE HYCLATE 100 MG PO TABS: 100.0000 mg | ORAL_TABLET | Freq: Two times a day (BID) | ORAL | Status: DC

## 2014-08-29 NOTE — Telephone Encounter (Signed)
Doxy 100 mg 1 twice daily #20 with 1 refill per Dr Sherren Mocha

## 2015-02-21 ENCOUNTER — Other Ambulatory Visit: Payer: Self-pay | Admitting: Internal Medicine

## 2015-04-25 ENCOUNTER — Other Ambulatory Visit: Payer: Self-pay | Admitting: Family Medicine

## 2015-04-25 DIAGNOSIS — R634 Abnormal weight loss: Secondary | ICD-10-CM

## 2015-04-25 DIAGNOSIS — R5382 Chronic fatigue, unspecified: Secondary | ICD-10-CM

## 2015-04-25 DIAGNOSIS — R109 Unspecified abdominal pain: Secondary | ICD-10-CM

## 2015-04-29 ENCOUNTER — Other Ambulatory Visit (HOSPITAL_COMMUNITY)
Admission: RE | Admit: 2015-04-29 | Discharge: 2015-04-29 | Disposition: A | Discharge status: Home / Self Care | Payer: 59 | Source: Ambulatory Visit | Attending: Family Medicine | Admitting: Family Medicine

## 2015-04-29 ENCOUNTER — Ambulatory Visit (HOSPITAL_COMMUNITY)
Admission: RE | Admit: 2015-04-29 | Discharge: 2015-04-29 | Disposition: A | Discharge status: Home / Self Care | Payer: 59 | Source: Ambulatory Visit | Attending: Family Medicine | Admitting: Family Medicine

## 2015-04-29 ENCOUNTER — Encounter (HOSPITAL_COMMUNITY): Payer: Self-pay

## 2015-04-29 DIAGNOSIS — R590 Localized enlarged lymph nodes: Secondary | ICD-10-CM | POA: Diagnosis not present

## 2015-04-29 DIAGNOSIS — R918 Other nonspecific abnormal finding of lung field: Secondary | ICD-10-CM | POA: Insufficient documentation

## 2015-04-29 DIAGNOSIS — K869 Disease of pancreas, unspecified: Secondary | ICD-10-CM | POA: Insufficient documentation

## 2015-04-29 DIAGNOSIS — R160 Hepatomegaly, not elsewhere classified: Secondary | ICD-10-CM | POA: Insufficient documentation

## 2015-04-29 DIAGNOSIS — R109 Unspecified abdominal pain: Secondary | ICD-10-CM | POA: Insufficient documentation

## 2015-04-29 DIAGNOSIS — R5382 Chronic fatigue, unspecified: Secondary | ICD-10-CM | POA: Diagnosis not present

## 2015-04-29 DIAGNOSIS — I10 Essential (primary) hypertension: Secondary | ICD-10-CM | POA: Diagnosis present

## 2015-04-29 DIAGNOSIS — R634 Abnormal weight loss: Secondary | ICD-10-CM | POA: Insufficient documentation

## 2015-04-29 LAB — URINALYSIS, ROUTINE W REFLEX MICROSCOPIC
Glucose, UA: NEGATIVE mg/dL
HGB URINE DIPSTICK: NEGATIVE
KETONES UR: 15 mg/dL — AB
NITRITE: POSITIVE — AB
PH: 5.5 (ref 5.0–8.0)
Protein, ur: 30 mg/dL — AB
Specific Gravity, Urine: 1.028 (ref 1.005–1.030)

## 2015-04-29 LAB — CBC WITH DIFFERENTIAL/PLATELET
BASOS ABS: 0 10*3/uL (ref 0.0–0.1)
BASOS PCT: 0 %
EOS ABS: 0.2 10*3/uL (ref 0.0–0.7)
Eosinophils Relative: 1 %
HEMATOCRIT: 39 % (ref 39.0–52.0)
Hemoglobin: 13 g/dL (ref 13.0–17.0)
LYMPHS ABS: 2.9 10*3/uL (ref 0.7–4.0)
Lymphocytes Relative: 14 %
MCH: 33 pg (ref 26.0–34.0)
MCHC: 33.3 g/dL (ref 30.0–36.0)
MCV: 99 fL (ref 78.0–100.0)
Monocytes Absolute: 1.6 10*3/uL — ABNORMAL HIGH (ref 0.1–1.0)
Monocytes Relative: 8 %
NEUTROS ABS: 15.9 10*3/uL — AB (ref 1.7–7.7)
NEUTROS PCT: 77 %
Platelets: 271 10*3/uL (ref 150–400)
RBC: 3.94 MIL/uL — ABNORMAL LOW (ref 4.22–5.81)
RDW: 13.5 % (ref 11.5–15.5)
WBC: 20.6 10*3/uL — ABNORMAL HIGH (ref 4.0–10.5)

## 2015-04-29 LAB — URINE MICROSCOPIC-ADD ON

## 2015-04-29 LAB — COMPREHENSIVE METABOLIC PANEL
ALT: 66 U/L — AB (ref 17–63)
ANION GAP: 11 (ref 5–15)
AST: 76 U/L — ABNORMAL HIGH (ref 15–41)
Albumin: 2.5 g/dL — ABNORMAL LOW (ref 3.5–5.0)
Alkaline Phosphatase: 378 U/L — ABNORMAL HIGH (ref 38–126)
BUN: 17 mg/dL (ref 6–20)
CALCIUM: 8.9 mg/dL (ref 8.9–10.3)
CHLORIDE: 103 mmol/L (ref 101–111)
CO2: 22 mmol/L (ref 22–32)
CREATININE: 1.02 mg/dL (ref 0.61–1.24)
Glucose, Bld: 97 mg/dL (ref 65–99)
Potassium: 4.8 mmol/L (ref 3.5–5.1)
SODIUM: 136 mmol/L (ref 135–145)
Total Bilirubin: 1.6 mg/dL — ABNORMAL HIGH (ref 0.3–1.2)
Total Protein: 7.2 g/dL (ref 6.5–8.1)

## 2015-04-29 LAB — AMYLASE: AMYLASE: 88 U/L (ref 28–100)

## 2015-04-29 LAB — GAMMA GT: GGT: 607 U/L — AB (ref 7–50)

## 2015-04-29 LAB — LIPASE, BLOOD: LIPASE: 39 U/L (ref 11–51)

## 2015-04-29 MED ORDER — IOHEXOL 300 MG/ML  SOLN
100.0000 mL | Freq: Once | INTRAMUSCULAR | Status: AC | PRN
  Administered 2015-04-29: 100 mL via INTRAVENOUS

## 2015-04-30 ENCOUNTER — Encounter: Payer: Self-pay | Admitting: *Deleted

## 2015-04-30 ENCOUNTER — Ambulatory Visit (HOSPITAL_BASED_OUTPATIENT_CLINIC_OR_DEPARTMENT_OTHER): Payer: 59 | Admitting: Oncology

## 2015-04-30 ENCOUNTER — Other Ambulatory Visit: Payer: Self-pay | Admitting: Radiology

## 2015-04-30 ENCOUNTER — Telehealth: Payer: Self-pay | Admitting: *Deleted

## 2015-04-30 VITALS — BP 122/58 | HR 87 | Temp 98.0°F | Resp 18 | Ht 71.0 in | Wt 198.1 lb

## 2015-04-30 DIAGNOSIS — K769 Liver disease, unspecified: Secondary | ICD-10-CM | POA: Diagnosis not present

## 2015-04-30 DIAGNOSIS — R109 Unspecified abdominal pain: Secondary | ICD-10-CM | POA: Diagnosis not present

## 2015-04-30 DIAGNOSIS — K869 Disease of pancreas, unspecified: Secondary | ICD-10-CM

## 2015-04-30 DIAGNOSIS — C787 Secondary malignant neoplasm of liver and intrahepatic bile duct: Principal | ICD-10-CM

## 2015-04-30 DIAGNOSIS — M549 Dorsalgia, unspecified: Secondary | ICD-10-CM

## 2015-04-30 DIAGNOSIS — C259 Malignant neoplasm of pancreas, unspecified: Secondary | ICD-10-CM

## 2015-04-30 DIAGNOSIS — K8689 Other specified diseases of pancreas: Secondary | ICD-10-CM

## 2015-04-30 DIAGNOSIS — R630 Anorexia: Secondary | ICD-10-CM

## 2015-04-30 DIAGNOSIS — R197 Diarrhea, unspecified: Secondary | ICD-10-CM

## 2015-04-30 DIAGNOSIS — R634 Abnormal weight loss: Secondary | ICD-10-CM

## 2015-04-30 MED ORDER — PANCRELIPASE (LIP-PROT-AMYL) 36000-114000 UNITS PO CPEP: 72000.0000 [IU] | ORAL_CAPSULE | Freq: Three times a day (TID) | ORAL | Status: DC

## 2015-04-30 MED ORDER — HYDROCODONE-ACETAMINOPHEN 5-325 MG PO TABS: 1.0000 | ORAL_TABLET | ORAL | Status: DC | PRN

## 2015-04-30 MED FILL — CREON DR 36,000 UNITS CAP: 36000 | 30 days supply | Qty: 180 | Fill #0

## 2015-04-30 NOTE — Telephone Encounter (Signed)
Oncology Nurse Navigator Documentation  Oncology Nurse Navigator Flowsheets 04/30/2015  Navigator Location [facility]  Navigator Encounter Type Introductory phone call  Abnormal Finding Date 04/29/2015  Per Dr. Benay Spice, called patient with appointment to see Dr. Benay Spice today at 3:30 as new patient. He agrees to time.

## 2015-04-30 NOTE — Progress Notes (Signed)
[facility] New Patient Consult   Referring MD: Dorena Cookey, [address], [place] [address]   Jeremy Moody 70 y.o.  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 He reports developing increased back pain several months ago which she attributed to chronic disc disease. He saw Dr. Ellene Route in December and had an epidural steroid injection. This did not help the pain. He reports reflux symptoms at Christmas that improved with Nexium. For the past few weeks she has developed cramping abdominal pain, diarrhea, and dark urine. He has associated anorexia. He contacted Dr. Sherren Mocha and was referred for a CT of the abdomen and pelvis yesterday.  The CT revealed small irregular pulmonary nodules at the lung bases, innumerable liver masses, and an infiltrative hypoenhancing mass in the uncinate 8 process of the pancreas. The mass encases the superior mesenteric artery and 50% of the superior mesenteric vein. Normal caliber small bowel with no small bowel wall thickening. Celiac, porta hepatis, portacaval, aortocaval, and left periaortic lymphadenopathy was noted. No ascites. Degenerative changes were noted at the thoracic or lumbar spine. No aggressive-appearing focal osseous lesions.    Past Medical History  Diagnosis Date  . Degenerative disc disease   . Atrial fibrillation and flutter ([place])     .  History of a right spermatocele  Past Surgical History  Procedure Laterality Date  . Ablation  08/2005    for A-flutter  . Cervical laminectomy      x3  . Arm wound repair / closure    . Basal cell carcinoma excision      Medications: Reviewed  Allergies: No Known Allergies  Family history: His mother died of lung cancer at age 79 and was a smoker. No other family history of cancer.  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 He has a history of moderate alcohol use. No transfusion history.  History  Alcohol Use  . 3.0 - 4.0 oz/week  . 6-8 drink(s) per week    History  Smoking status  . Never Smoker   Smokeless tobacco  . Never Used      ROS:   Positives include: Anorexia, 30 pound weight loss, nausea, diarrhea 2-3 times per day partially improved with Imodium Thomas dark urine, mild exertional dyspnea, abdominal pain and tenderness, back pain  A complete ROS was otherwise negative.  Physical Exam:  Blood pressure 122/58, pulse 87, temperature 98 F (36.7 C), temperature source Oral, resp. rate 18, height 5\' 11"  (1.803 m), weight 198 lb 1.6 oz (89.858 kg), SpO2 100 %.  HEENT: Oropharynx without visible mass, sclera anicteric, neck without mass Lungs: Clear bilaterally Cardiac: Regular rate and rhythm Abdomen: The liver edge is palpable throughout the right and mid abdomen with associated tenderness. No apparent ascites. No splenomegaly. GU: Testes without mass, right spermatocele  Vascular: No leg edema Lymph nodes: No cervical, supra-clavicular, axillary, or inguinal nodes Neurologic: Alert and oriented, the motor exam appears intact in the upper and lower extremities Skin: No rash Musculoskeletal: No spine tenderness   LAB:  CBC  Lab Results  Component Value Date   WBC 20.6* 04/29/2015   HGB 13.0 04/29/2015   HCT 39.0 04/29/2015   MCV 99.0 04/29/2015   PLT 271 04/29/2015   NEUTROABS 15.9* 04/29/2015     CMP  Component Value Date/Time   NA 136 04/29/2015 0954   K 4.8 04/29/2015 0954   CL 103 04/29/2015 0954   CO2 22 04/29/2015 0954   GLUCOSE 97 04/29/2015 0954   BUN 17 04/29/2015 0954   CREATININE 1.02 04/29/2015 0954   CALCIUM 8.9 04/29/2015 0954   PROT 7.2 04/29/2015 0954   ALBUMIN 2.5* 04/29/2015 0954   AST 76* 04/29/2015 0954   ALT 66* 04/29/2015 0954   ALKPHOS 378* 04/29/2015 0954   BILITOT 1.6* 04/29/2015 0954   GFRNONAA >60 04/29/2015 0954   GFRAA  >60 04/29/2015 0954      Imaging:  Ct Abdomen Pelvis W Contrast  04/29/2015  CLINICAL DATA:  70 year old male with fatigue, 35 pound weight loss and abdominal pain over the last 6 months. EXAM: CT ABDOMEN AND PELVIS WITH CONTRAST TECHNIQUE: Multidetector CT imaging of the abdomen and pelvis was performed using the standard protocol following bolus administration of intravenous contrast. CONTRAST:  145mL OMNIPAQUE IOHEXOL 300 MG/ML  SOLN COMPARISON:  None. FINDINGS: Lower chest: There are innumerable small irregular pulmonary nodules at both lung bases (greater than 30), largest 1.0 cm in the basilar right lower lobe (series 3/image 16). There is a mildly enlarged 0.9 cm right pericardiophrenic lymph node (series 2/ image 6). Hepatobiliary: There are innumerable (> than 25) hypodense confluent liver masses of various sizes scattered throughout the liver, with representative liver masses as follows: - segment 2 left liver lobe 3.9 x 3.8 cm mass (series 2/ image 17) - segment 7 right liver lobe 6.6 x 5.2 cm mass (series 2/ image 23) - segments 4a/8 5.2 x 3.9 cm liver mass (series 2/image 18) Normal gallbladder with no radiopaque cholelithiasis. No intrahepatic biliary ductal dilatation. Minimally dilated common bile duct (7 mm diameter). Pancreas: There is an infiltrative hypoenhancing 5.4 x 2.9 x 4.8 cm pancreatic mass centered in the uncinate process of the pancreas (series 2/ image 33 and series 5/ image 71). This pancreatic mass encases at least 80% of the SMA circumference. This pancreatic mass encases approximately 50% of the SMV circumference, with associated high-grade narrowing of the SMV (series 5/ image 66). No definite main portal vein encasement. Spleen: Normal size. No mass. Adrenals/Urinary Tract: Normal adrenals. Simple 2.5 cm medial upper right renal cyst. Otherwise normal kidneys, with no hydronephrosis. Normal bladder. Stomach/Bowel: Grossly normal stomach. Normal caliber small bowel with no  small bowel wall thickening. Normal diminutive appendix. Normal large bowel with no diverticulosis, large bowel wall thickening or pericolonic fat stranding. Vascular/Lymphatic: Atherosclerotic nonaneurysmal abdominal aorta. Splenic and portal veins remain patent. Patent renal veins. There is mass-effect on the hepatic veins by surrounding liver masses, and the hepatic veins appear to remain patent. There is para celiac, porta hepatis, portacaval, aortocaval and left para-aortic lymphadenopathy. For example, there are enlarged 2.4 cm porta hepatis (series 2/ image 24), 2.4 cm portacaval (series 2/image 20) and 1.7 cm left periaortic (series 2/ image 37) lymph nodes. There a few scattered mildly enlarged mesenteric lymph nodes, largest 1.0 cm in the central left mesentery (series 2/image 46). There is an enlarged 1.7 cm right common iliac node (series 2/image 63). There is an enlarged 1.2 cm left common iliac node (series 2/image 57). There is an enlarged 1.5 cm right external iliac lymph node (series 2/image 60). Reproductive: Normal size prostate and seminal vesicles, with nonspecific internal prostatic calcifications. Other: No pneumoperitoneum, ascites or focal fluid collection. There is fat stranding and ill-defined fluid throughout the anterior perinephric retroperitoneal space and bilateral pelvic  anterior extraperitoneal space. Musculoskeletal: No aggressive appearing focal osseous lesions. Moderate to marked degenerative changes throughout the thoracolumbar spine. Small to moderate left and small right fat containing inguinal hernias. IMPRESSION: 1. Infiltrative hypoenhancing 5.4 cm pancreatic mass centered in the uncinate process, most in keeping with the primary pancreatic adenocarcinoma, with local vascular encasement as described. 2. Extensive retroperitoneal, mesenteric, pelvic and lower mediastinal lymphadenopathy, in keeping with nodal metastatic disease. 3. Innumerable hypodense liver masses, in  keeping with widespread liver metastases. 4. Innumerable small pulmonary nodules at both lung bases, in keeping with widespread pulmonary metastases. 5. Nonspecific fat stranding and ill-defined fluid throughout the bilateral anterior retroperitoneum and anterior pelvic extraperitoneal space, cannot exclude superimposed acute pancreatitis or tumor related hemorrhage. No focal drainable fluid collection. These results were called by telephone at the time of interpretation on 04/29/2015 at 4:50 pm to Dr. Dellis Filbert TODD , who verbally acknowledged these results. Electronically Signed   By: Ilona Sorrel M.D.   On: 04/29/2015 16:58   CT images reviewed   Assessment/Plan:   1. Pancreas mass  CT abdomen/pelvis 04/29/2015 revealed a pancreas uncinate mass, liver metastases, lung nodules, extensive retroperitoneal/mesenteric, pelvic, and low mediastinal lymphadenopathy 2. Anorexia/weight loss  3.   Abdominal/back pain secondary to the pancreas mass and liver metastases  4.    History of atrial fibrillation and flutter  5.    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 A CT of the abdomen on 04/29/2015 is consistent with a diagnosis of metastatic pancreas cancer. I discussed the probable diagnosis and treatment options with him. He agrees to an ultrasound-guided biopsy of a liver lesion to confirm a diagnosis of pancreas cancer. He most likely has metastatic adenocarcinoma of the pancreas, but the differential diagnosis includes a pancreas neuroendocrine tumor or Islet cell tumor.  He understands no therapy will be curative if a diagnosis of pancreas cancer is confirmed. We will consider systemic chemotherapy options. My initial impression is to recommend gemcitabine/Abraxane chemotherapy. His performance status is borderline to receive FOLFIRINOX. He indicated that he would like to avoid toxicity from  chemotherapy as possible.  He will begin a trial of pancreatic enzyme replacement. We refilled a prescription for hydrocodone.  Dr. Redmond Pulling will be referred for an ultrasound-guided biopsy of a liver lesion and Port-A-Cath placement.  He will return for an office visit 05/03/2015.  Approximately 50 minutes were spent with the patient today. The majority of the time was used for counseling and coordination of care.   [place], [place] 04/30/2015, 5:26 PM

## 2015-04-30 NOTE — Progress Notes (Signed)
Oncology Nurse Navigator Documentation  Oncology Nurse Navigator Flowsheets 04/30/2015  Navigator Location [facility]  Navigator Encounter Type Initial MedOnc  Abnormal Finding Date 04/29/2015  Patient Visit Type MedOnc;Initial  Treatment Phase Treatment planning  Barriers/Navigation Needs Family concerns;Education  Education Understanding Cancer/ Treatment Options;Pain/ Symptom Management;Newly Diagnosed Cancer Education;Preparing for Upcoming Biopsy/PAC/ Treatment  Interventions Referrals  Referrals Nutrition/dietician  Support Groups/Services GI Support Group  Acuity Level 1  Time Spent with Patient 60  Met with patient and his wife, Charlett Nose during new patient visit. Explained the role of the GI Nurse Navigator and provided New Patient Packet with information on: 1. Pancreatic cancer 2. Support groups 3. Advanced Directives 4. Fall Safety Plan Answered questions, reviewed current treatment plan using TEACH back and provided emotional support. Provided copy of current treatment plan. Provided samples of different supplements of Boost, Ensure Enlive, Ensure Breeze to try before he purchases these. Will inquire with lab at [facility] if CA 19-9 can be added to his labs drawn there on 04/29/15.   Merceda Elks, RN, BSN GI Oncology [place]

## 2015-05-01 ENCOUNTER — Telehealth: Payer: Self-pay | Admitting: *Deleted

## 2015-05-01 ENCOUNTER — Other Ambulatory Visit: Payer: Self-pay | Admitting: Radiology

## 2015-05-01 ENCOUNTER — Telehealth (HOSPITAL_COMMUNITY): Payer: Self-pay | Admitting: *Deleted

## 2015-05-01 ENCOUNTER — Other Ambulatory Visit: Payer: Self-pay | Admitting: *Deleted

## 2015-05-01 ENCOUNTER — Ambulatory Visit (HOSPITAL_COMMUNITY): Admission: RE | Admit: 2015-05-01 | Payer: 59 | Source: Ambulatory Visit

## 2015-05-01 MED FILL — HYDROCODON-APAP 5-325: 5-325 | 10 days supply | Qty: 60 | Fill #0

## 2015-05-01 NOTE — Telephone Encounter (Signed)
Late Entry 12:40; Spoke with Dr. Redmond Pulling and he stated "I tried to explain to them this is not about pain; my pain is fine.  I'm frustrated that yesterday they left message at my office and office email re: liver biopsy this morning and I did not get the message because I didn't go back to my office after leaving the cancer center yesterday at 6pm; please tell Dr. Benay Spice to facilitate getting my liver biopsy and port in this week"  Dr. Benay Spice made aware.

## 2015-05-01 NOTE — Telephone Encounter (Signed)
Selected wrong encounter.

## 2015-05-01 NOTE — Telephone Encounter (Signed)
Per staff message from GI navigator I have scheduled appt. She will call the patient

## 2015-05-01 NOTE — Telephone Encounter (Signed)
Oncology Nurse Navigator Documentation  Oncology Nurse Navigator Flowsheets 05/01/2015  Navigator Location [facility]  Navigator Encounter Type Telephone  Telephone Outgoing Call  Abnormal Finding Date 04/29/2015  Patient Visit Type -  Treatment Phase Treatment planning  Barriers/Navigation Needs Coordination of Care  Education Preparing for Upcoming Surgery/ Treatment  Interventions Coordination of Care--calls to IR to arrange for port same day as chemo  Referrals -  Coordination of Care Appts;Chemo--scheduled for 1/17 after PAC placed  Support Groups/Services -  Acuity Level 2  Time Spent with Patient 30  Dr. Redmond Pulling notified of [facility] date/time and he will have his 1st Gemzar/Abraxane on 05/07/15 at 12:00 (port will remain accessed). Will have collaborative nurse call in  EMLA and antiemetic. Navigator will provide education on 1/13 at [place].

## 2015-05-01 NOTE — Telephone Encounter (Signed)
Triage Pager reads "Needs to talk with someone ASAP.  He is in a lot of pain."  This nurse called who expressed he is "disturbed that he's been calling since 10:00 am and can't reach Dr. Benay Spice or navigator.  I can't get a nurse."  Advised I am a nurse.  "I have metastatic pancreatic cancer, having scheduling problem with things Dr Benay Spice wants done this week.  They called my office and I'm not at the office.  Could you hunt navigator down to call me at [phone].  I ned a port-a-cath and Liver Biopsy."  Tried to assess pain and there is no pain needs at this time.

## 2015-05-02 ENCOUNTER — Ambulatory Visit (HOSPITAL_COMMUNITY)
Admission: RE | Admit: 2015-05-02 | Discharge: 2015-05-02 | Disposition: A | Discharge status: Home / Self Care | Payer: 59 | Source: Ambulatory Visit | Attending: Oncology | Admitting: Oncology

## 2015-05-02 ENCOUNTER — Ambulatory Visit (HOSPITAL_COMMUNITY): Payer: 59

## 2015-05-02 ENCOUNTER — Other Ambulatory Visit: Payer: Self-pay | Admitting: *Deleted

## 2015-05-02 ENCOUNTER — Other Ambulatory Visit: Payer: Self-pay | Admitting: Oncology

## 2015-05-02 ENCOUNTER — Telehealth: Payer: Self-pay | Admitting: Oncology

## 2015-05-02 DIAGNOSIS — K769 Liver disease, unspecified: Secondary | ICD-10-CM | POA: Diagnosis not present

## 2015-05-02 DIAGNOSIS — C259 Malignant neoplasm of pancreas, unspecified: Secondary | ICD-10-CM | POA: Insufficient documentation

## 2015-05-02 DIAGNOSIS — K7689 Other specified diseases of liver: Secondary | ICD-10-CM | POA: Diagnosis not present

## 2015-05-02 DIAGNOSIS — C787 Secondary malignant neoplasm of liver and intrahepatic bile duct: Secondary | ICD-10-CM | POA: Diagnosis not present

## 2015-05-02 DIAGNOSIS — R160 Hepatomegaly, not elsewhere classified: Secondary | ICD-10-CM | POA: Insufficient documentation

## 2015-05-02 DIAGNOSIS — K869 Disease of pancreas, unspecified: Secondary | ICD-10-CM | POA: Insufficient documentation

## 2015-05-02 DIAGNOSIS — C229 Malignant neoplasm of liver, not specified as primary or secondary: Secondary | ICD-10-CM | POA: Diagnosis not present

## 2015-05-02 DIAGNOSIS — K8689 Other specified diseases of pancreas: Secondary | ICD-10-CM

## 2015-05-02 LAB — CBC WITH DIFFERENTIAL/PLATELET
BASOS ABS: 0 10*3/uL (ref 0.0–0.1)
Basophils Relative: 0 %
Eosinophils Absolute: 0.1 10*3/uL (ref 0.0–0.7)
Eosinophils Relative: 1 %
HEMATOCRIT: 34.3 % — AB (ref 39.0–52.0)
HEMOGLOBIN: 11.6 g/dL — AB (ref 13.0–17.0)
LYMPHS PCT: 14 %
Lymphs Abs: 2.4 10*3/uL (ref 0.7–4.0)
MCH: 33.4 pg (ref 26.0–34.0)
MCHC: 33.8 g/dL (ref 30.0–36.0)
MCV: 98.8 fL (ref 78.0–100.0)
MONO ABS: 1 10*3/uL (ref 0.1–1.0)
Monocytes Relative: 6 %
NEUTROS ABS: 13.4 10*3/uL — AB (ref 1.7–7.7)
Neutrophils Relative %: 79 %
Platelets: 274 10*3/uL (ref 150–400)
RBC: 3.47 MIL/uL — AB (ref 4.22–5.81)
RDW: 14 % (ref 11.5–15.5)
WBC: 16.9 10*3/uL — AB (ref 4.0–10.5)

## 2015-05-02 LAB — COMPREHENSIVE METABOLIC PANEL
ALK PHOS: 388 U/L — AB (ref 38–126)
ALT: 59 U/L (ref 17–63)
AST: 71 U/L — AB (ref 15–41)
Albumin: 2.5 g/dL — ABNORMAL LOW (ref 3.5–5.0)
Anion gap: 10 (ref 5–15)
BILIRUBIN TOTAL: 2.2 mg/dL — AB (ref 0.3–1.2)
BUN: 21 mg/dL — AB (ref 6–20)
CO2: 23 mmol/L (ref 22–32)
CREATININE: 0.85 mg/dL (ref 0.61–1.24)
Calcium: 8.6 mg/dL — ABNORMAL LOW (ref 8.9–10.3)
Chloride: 104 mmol/L (ref 101–111)
GFR calc Af Amer: >60 mL/min (ref >60)
Glucose, Bld: 103 mg/dL — ABNORMAL HIGH (ref 65–99)
Potassium: 4.5 mmol/L (ref 3.5–5.1)
Sodium: 137 mmol/L (ref 135–145)
TOTAL PROTEIN: 6.8 g/dL (ref 6.5–8.1)

## 2015-05-02 LAB — PROTIME-INR
INR: 1.1 (ref 0.00–1.49)
PROTHROMBIN TIME: 14.4 s (ref 11.6–15.2)

## 2015-05-02 MED ORDER — SODIUM CHLORIDE 0.9 % IV SOLN: INTRAVENOUS | Status: DC

## 2015-05-02 MED ORDER — HYDROCODONE-ACETAMINOPHEN 5-325 MG PO TABS
1.0000 | ORAL_TABLET | ORAL | Status: DC | PRN
  Filled 2015-05-02: qty 2

## 2015-05-02 MED ORDER — PROCHLORPERAZINE MALEATE 10 MG PO TABS: 10.0000 mg | ORAL_TABLET | Freq: Four times a day (QID) | ORAL | Status: AC | PRN

## 2015-05-02 MED ORDER — FENTANYL CITRATE (PF) 100 MCG/2ML IJ SOLN
INTRAMUSCULAR | Status: AC | PRN
  Administered 2015-05-02 (×2): 25 ug via INTRAVENOUS
  Administered 2015-05-02: 50 ug via INTRAVENOUS

## 2015-05-02 MED ORDER — FLUMAZENIL 0.5 MG/5ML IV SOLN
INTRAVENOUS | Status: AC
  Filled 2015-05-02: qty 5

## 2015-05-02 MED ORDER — MIDAZOLAM HCL 2 MG/2ML IJ SOLN
INTRAMUSCULAR | Status: AC
  Filled 2015-05-02: qty 6

## 2015-05-02 MED ORDER — NALOXONE HCL 0.4 MG/ML IJ SOLN
INTRAMUSCULAR | Status: AC
  Filled 2015-05-02: qty 1

## 2015-05-02 MED ORDER — FENTANYL CITRATE (PF) 100 MCG/2ML IJ SOLN
INTRAMUSCULAR | Status: AC
  Filled 2015-05-02: qty 4

## 2015-05-02 MED ORDER — MIDAZOLAM HCL 2 MG/2ML IJ SOLN
INTRAMUSCULAR | Status: AC | PRN
  Administered 2015-05-02: 1 mg via INTRAVENOUS
  Administered 2015-05-02: 0.5 mg via INTRAVENOUS
  Administered 2015-05-02: 1 mg via INTRAVENOUS
  Administered 2015-05-02: 0.5 mg via INTRAVENOUS

## 2015-05-02 MED ORDER — LIDOCAINE-PRILOCAINE 2.5-2.5 % EX CREA: TOPICAL_CREAM | CUTANEOUS | Status: AC

## 2015-05-02 MED FILL — LIDOCAINE-PRILOCAINE CREAM: 2.5-2.5 | 30 days supply | Qty: 30 | Fill #0

## 2015-05-02 MED FILL — PROCHLORPERAZINE 10 MG TAB: 10 | 8 days supply | Qty: 30 | Fill #0

## 2015-05-02 NOTE — Telephone Encounter (Signed)
Spoke with patient and he is aware of his NUT appointment 1/16 as 1/17 with chemo is not available

## 2015-05-02 NOTE — Discharge Instructions (Signed)
Liver Biopsy, Care After Refer to this sheet in the next few weeks. These instructions provide you with information on caring for yourself after your procedure. Your health care provider may also give you more specific instructions. Your treatment has been planned according to current medical practices, but problems sometimes occur. Call your health care provider if you have any problems or questions after your procedure. WHAT TO EXPECT AFTER THE PROCEDURE After your procedure, it is typical to have the following:  A small amount of discomfort in the area where the biopsy was done and in the right shoulder or shoulder blade.  A small amount of bruising around the area where the biopsy was done and on the skin over the liver.  Sleepiness and fatigue for the rest of the day. HOME CARE INSTRUCTIONS   Rest at home for 1-2 days or as directed by your health care provider.  Have a friend or family member stay with you for at least 24 hours.  Because of the medicines used during the procedure, you should not do the following things in the first 24 hours:  Drive.  Use machinery.  Be responsible for the care of other people.  Sign legal documents.  Take a bath or shower.  There are many different ways to close and cover an incision, including stitches, skin glue, and adhesive strips. Follow your health care provider's instructions on:  Incision care.  Bandage (dressing) changes and removal.  Incision closure removal.  Do not drink alcohol in the first week.  Do not lift more than 5 pounds or play contact sports for 2 weeks after this test.  Take medicines only as directed by your health care provider. Do not take medicine containing aspirin or non-steroidal anti-inflammatory medicines such as ibuprofen for 1 week after this test.  It is your responsibility to get your test results. SEEK MEDICAL CARE IF:   You have increased bleeding from an incision that results in more than a  small spot of blood.  You have redness, swelling, or increasing pain in any incisions.  You notice a discharge or a bad smell coming from any of your incisions.  You have a fever or chills. SEEK IMMEDIATE MEDICAL CARE IF:   You develop swelling, bloating, or pain in your abdomen.  You become dizzy or faint.  You develop a rash.  You are nauseous or vomit.  You have difficulty breathing, feel short of breath, or feel faint.  You develop chest pain.  You have problems with your speech or vision.  You have trouble balancing or moving your arms or legs.   This information is not intended to replace advice given to you by your health care provider. Make sure you discuss any questions you have with your health care provider.   Document Released: 10/24/2004 Document Revised: 04/27/2014 Document Reviewed: 06/02/2013 Elsevier Interactive Patient Education 2016 [employer] Liver Biopsy The liver is a large organ in the upper right-hand side of your abdomen. A liver biopsy is a procedure in which a tissue sample is taken from the liver and examined under a microscope. The procedure is done to confirm a suspected problem. There are three types of liver biopsies:  Percutaneous. In this type, an incision is made in your abdomen. The sample is removed through the incision with a needle.  Laparoscopic. In this type, several incisions are made in the abdomen. A tiny camera is passed through one of the incisions to help guide the health care provider.  The sample is removed through the other incision or incisions. °· Transjugular. In this type, an incision is made in the neck. A tube is passed through the incision to the liver. The sample is removed through the tube with a needle. °LET YOUR HEALTH CARE PROVIDER KNOW ABOUT: °· Any allergies you have. °· All medicines you are taking, including vitamins, herbs, eye drops, creams, and over-the-counter medicines. °· Previous problems you or members of  your family have had with the use of anesthetics. °· Any blood disorders you have. °· Previous surgeries you have had. °· Medical conditions you have. °· Possibility of pregnancy, if this applies. °RISKS AND COMPLICATIONS °Generally, this is a safe procedure. However, problems can occur and include: °· Bleeding. °· Infection. °· Bruising. °· Collapsed lung. °· Leak of digestive juices (bile) from the liver or gallbladder. °· Problems with heart rhythm. °· Pain at the biopsy site or in the right shoulder. °· Low blood pressure (hypotension). °· Injury to nearby organs or tissues. °BEFORE THE PROCEDURE °· Your health care provider may do some blood or urine tests. These will help your health care provider learn how well your kidneys and liver are working and how well your blood clots. °· Ask your health care provider if you will be able to go home the day of the procedure. Arrange for someone to take you home and stay with you for at least 24 hours. °· Do not eat or drink anything after midnight on the night before the procedure or as directed by your health care provider. °· Ask your health care provider about: °· Changing or stopping your regular medicines. This is especially important if you are taking diabetes medicines or blood thinners. °· Taking medicines such as aspirin and ibuprofen. These medicines can thin your blood. Do not take these medicines before your procedure if your health care provider asks you not to. °PROCEDURE °Regardless of the type of biopsy that will be done, you will have an IV line placed. Through this line, you will receive fluids and medicine to relax you. If you will be having a laparoscopic biopsy, you may also receive medicine through this line to make you sleep during the procedure (general anesthetic). °Percutaneous Liver Biopsy °· You will positioned on your back, with your right hand over your head. °· A health care provider will locate your liver by tapping and pressing on the  right side of your abdomen or with the help of an ultrasound machine or CT scan. °· An area at the bottom of your last right rib will be numbed. °· An incision will be made in the numbed area. °· The biopsy needle will be inserted into the incision. °· Several samples of liver tissue will be taken with the biopsy needle. You will be asked to hold your breath as each sample is taken. °Laparoscopic Liver Biopsy °· You will be positioned on your back. °· Several small incisions will be made in your abdomen. °· Your doctor will pass a tiny camera through one incision. The camera will allow the liver to be viewed on a TV monitor in the operating room. °· Tools will be passed through the other incision or incisions. These tools will be used to remove samples of liver tissue. °Transjugular Liver Biopsy °· You will be positioned on your back on an X-ray table, with your head turned to your left. °· An area on your neck just over your jugular vein will be numbed. °· An incision   will be made in the numbed area. °· A tiny tube will be inserted through the incision. It will be pushed through the jugular vein to a blood vessel in the liver called the hepatic vein. °· Dye will be inserted through the tube, and X-rays will be taken. The dye will make the blood vessels in the liver light up on the X-rays. °· The biopsy needle will be pushed through the tube until it reaches the liver. °· Samples of liver tissue will be taken with the biopsy needle. °· The needle and the tube will be removed. °After the samples are obtained, the incision or incisions will be closed. °AFTER THE PROCEDURE °· You will be taken to a recovery area. °· You may have to lie on your right side for 1-2 hours. This will prevent bleeding from the biopsy site. °· Your progress will be watched. Your blood pressure, pulse, and the biopsy site will be checked often. °· You may have some pain or feel sick. If this happens, tell your health care provider. °· As you  begin to feel better, you will be offered ice and beverages. °· You may be allowed to go home when the medicines have worn off and you can walk, drink, eat, and use the bathroom. °  °This information is not intended to replace advice given to you by your health care provider. Make sure you discuss any questions you have with your health care provider. °  °Document Released: 06/27/2003 Document Revised: 04/27/2014 Document Reviewed: 06/02/2013 °Elsevier Interactive Patient Education ©2016 [employer] °Moderate Conscious Sedation, Adult, Care After °Refer to this sheet in the next few weeks. These instructions provide you with information on caring for yourself after your procedure. Your health care provider may also give you more specific instructions. Your treatment has been planned according to current medical practices, but problems sometimes occur. Call your health care provider if you have any problems or questions after your procedure. °WHAT TO EXPECT AFTER THE PROCEDURE  °After your procedure: °· You may feel sleepy, clumsy, and have poor balance for several hours. °· Vomiting may occur if you eat too soon after the procedure. °HOME CARE INSTRUCTIONS °· Do not participate in any activities where you could become injured for at least 24 hours. Do not: °¨ Drive. °¨ Swim. °¨ Ride a bicycle. °¨ Operate heavy machinery. °¨ Cook. °¨ Use power tools. °¨ Climb ladders. °¨ Work from a high place. °· Do not make important decisions or sign legal documents until you are improved. °· If you vomit, drink water, juice, or soup when you can drink without vomiting. Make sure you have little or no nausea before eating solid foods. °· Only take over-the-counter or prescription medicines for pain, discomfort, or fever as directed by your health care provider. °· Make sure you and your family fully understand everything about the medicines given to you, including what side effects may occur. °· You should not drink alcohol,  take sleeping pills, or take medicines that cause drowsiness for at least 24 hours. °· If you smoke, do not smoke without supervision. °· If you are feeling better, you may resume normal activities 24 hours after you were sedated. °· Keep all appointments with your health care provider. °SEEK MEDICAL CARE IF: °· Your skin is pale or bluish in color. °· You continue to feel nauseous or vomit. °· Your pain is getting worse and is not helped by medicine. °· You have bleeding or swelling. °· You are   still sleepy or feeling clumsy after 24 hours. °SEEK IMMEDIATE MEDICAL CARE IF: °· You develop a rash. °· You have difficulty breathing. °· You develop any type of allergic problem. °· You have a fever. °MAKE SURE YOU: °· Understand these instructions. °· Will watch your condition. °· Will get help right away if you are not doing well or get worse. °  °This information is not intended to replace advice given to you by your health care provider. Make sure you discuss any questions you have with your health care provider. °  °Document Released: 01/25/2013 Document Revised: 04/27/2014 Document Reviewed: 01/25/2013 °Elsevier Interactive Patient Education ©2016 [employer] Inc[employer] ° °

## 2015-05-02 NOTE — Procedures (Signed)
Interventional Radiology Procedure Note  Procedure: US guided bx of hepatic lesion. 18G cores obtained.   Complications: None  Estimated Blood Loss: 0  Recommendations: - Bedrest x 3 hrs - Path pending  Signed,  Criselda Peaches, MD

## 2015-05-02 NOTE — H&P (Signed)
Chief Complaint: Patient was seen in consultation today for ultrasound-guided liver lesion biopsy  Referring Physician(s): Sherrill,Gary B  History of Present Illness: Jeremy Moody is a 70 y.o. male with history of chronic back pain, recently noted cramping abdominal pain, weight loss, diarrhea, dark urine, anorexia and recent imaging studies revealing pancreatic mass with extensive retroperitoneal, mesenteric ,pelvic, lower mediastinal lymphadenopathy, liver lesions, and pulmonary nodules. He presents today for ultrasound-guided liver lesion biopsy for further evaluation.  Past Medical History  Diagnosis Date  . Degenerative disc disease   . Atrial fibrillation and flutter [facility])     Past Surgical History  Procedure Laterality Date  . Ablation  08/2005    for A-flutter  . Cervical laminectomy      x3  . Arm wound repair / closure    . Basal cell carcinoma excision      Allergies: Review of patient's allergies indicates no known allergies.  Medications: Prior to Admission medications   Medication Sig Start Date End Date Taking? Authorizing Provider  ALPRAZolam Duanne Moron) 0.5 MG tablet Take 0.5 mg by mouth daily as needed. 04/05/15  Yes Historical Provider, MD  flecainide (TAMBOCOR) 100 MG tablet TAKE 1 TABLET BY MOUTH TWICE DAILY 02/22/15  Yes Evans Lance, MD  HYDROcodone-acetaminophen (NORCO/VICODIN) 5-325 MG tablet Take 1 tablet by mouth every 4 (four) hours as needed. 04/30/15  Yes Ladell Pier, MD  ibuprofen (ADVIL,MOTRIN) 200 MG tablet Take 200 mg by mouth every 6 (six) hours as needed.   Yes Historical Provider, MD  lipase/protease/amylase (CREON) 36000 UNITS CPEP capsule Take 2 capsules (72,000 Units total) by mouth 3 (three) times daily before meals. 04/30/15  Yes Ladell Pier, MD  metoprolol tartrate (LOPRESSOR) 25 MG tablet TAKE 1 TABLET BY MOUTH TWICE A DAY 02/27/14  Yes Evans Lance, MD  Multiple Vitamin (MULTIVITAMIN) tablet Take 1 tablet by mouth  daily.   Yes Historical Provider, MD  aspirin 325 MG tablet Take 325 mg by mouth daily.    Historical Provider, MD  CIALIS 5 MG tablet Take 5 mg by mouth as needed. 01/22/15   Historical Provider, MD  fish oil-omega-3 fatty acids 1000 MG capsule Take 1 g by mouth daily.    Historical Provider, MD  lidocaine-prilocaine (EMLA) cream Place small amount over port area 1-2 hours prior to treatment and cover with plastic wrap.  DO NOT RUB IN 05/02/15   Ladell Pier, MD  prochlorperazine (COMPAZINE) 10 MG tablet Take 1 tablet (10 mg total) by mouth every 6 (six) hours as needed for nausea or vomiting. 05/02/15   Ladell Pier, MD     Family History  Problem Relation Age of Onset  . Colon cancer Neg Hx   . Lung cancer Mother   . Heart attack Father     Social History   Social History  . Marital Status: Married    Spouse Name: N/A  . Number of Children: 3  . Years of Education: N/A   Occupational History  . Physician    Social History Main Topics  . Smoking status: Never Smoker   . Smokeless tobacco: Never Used  . Alcohol Use: 3.0 - 4.0 oz/week    6-8 drink(s) per week  . Drug Use: No  . Sexual Activity: Not on file   Other Topics Concern  . Not on file   Social History Narrative     Review of Systems  Constitutional: Positive for appetite change and unexpected weight  change. Negative for fever and chills.  Respiratory: Negative for cough and shortness of breath.   Cardiovascular: Negative for chest pain.  Gastrointestinal: Positive for abdominal pain and diarrhea. Negative for nausea, vomiting and blood in stool.  Genitourinary: Negative for dysuria and hematuria.  Musculoskeletal: Positive for back pain.  Neurological: Negative for headaches.    Vital Signs: BP 99/67 mmHg  Pulse 64  Temp(Src) 98.2 F (36.8 C) (Oral)  Resp 18  SpO2 98%  Physical Exam  Constitutional: He is oriented to person, place, and time. He appears well-developed and well-nourished.    Cardiovascular: Normal rate and regular rhythm.   Pulmonary/Chest: Effort normal and breath sounds normal.  Abdominal: Soft. Bowel sounds are normal. There is tenderness.  Musculoskeletal: Normal range of motion. He exhibits no edema.  Neurological: He is alert and oriented to person, place, and time.  Skin:  Sl jaundiced    Mallampati Score:     Imaging: Ct Abdomen Pelvis W Contrast  04/29/2015  CLINICAL DATA:  70 year old male with fatigue, 35 pound weight loss and abdominal pain over the last 6 months. EXAM: CT ABDOMEN AND PELVIS WITH CONTRAST TECHNIQUE: Multidetector CT imaging of the abdomen and pelvis was performed using the standard protocol following bolus administration of intravenous contrast. CONTRAST:  163mL OMNIPAQUE IOHEXOL 300 MG/ML  SOLN COMPARISON:  None. FINDINGS: Lower chest: There are innumerable small irregular pulmonary nodules at both lung bases (greater than 30), largest 1.0 cm in the basilar right lower lobe (series 3/image 16). There is a mildly enlarged 0.9 cm right pericardiophrenic lymph node (series 2/ image 6). Hepatobiliary: There are innumerable (> than 25) hypodense confluent liver masses of various sizes scattered throughout the liver, with representative liver masses as follows: - segment 2 left liver lobe 3.9 x 3.8 cm mass (series 2/ image 17) - segment 7 right liver lobe 6.6 x 5.2 cm mass (series 2/ image 23) - segments 4a/8 5.2 x 3.9 cm liver mass (series 2/image 18) Normal gallbladder with no radiopaque cholelithiasis. No intrahepatic biliary ductal dilatation. Minimally dilated common bile duct (7 mm diameter). Pancreas: There is an infiltrative hypoenhancing 5.4 x 2.9 x 4.8 cm pancreatic mass centered in the uncinate process of the pancreas (series 2/ image 33 and series 5/ image 71). This pancreatic mass encases at least 80% of the SMA circumference. This pancreatic mass encases approximately 50% of the SMV circumference, with associated high-grade narrowing  of the SMV (series 5/ image 66). No definite main portal vein encasement. Spleen: Normal size. No mass. Adrenals/Urinary Tract: Normal adrenals. Simple 2.5 cm medial upper right renal cyst. Otherwise normal kidneys, with no hydronephrosis. Normal bladder. Stomach/Bowel: Grossly normal stomach. Normal caliber small bowel with no small bowel wall thickening. Normal diminutive appendix. Normal large bowel with no diverticulosis, large bowel wall thickening or pericolonic fat stranding. Vascular/Lymphatic: Atherosclerotic nonaneurysmal abdominal aorta. Splenic and portal veins remain patent. Patent renal veins. There is mass-effect on the hepatic veins by surrounding liver masses, and the hepatic veins appear to remain patent. There is para celiac, porta hepatis, portacaval, aortocaval and left para-aortic lymphadenopathy. For example, there are enlarged 2.4 cm porta hepatis (series 2/ image 24), 2.4 cm portacaval (series 2/image 20) and 1.7 cm left periaortic (series 2/ image 37) lymph nodes. There a few scattered mildly enlarged mesenteric lymph nodes, largest 1.0 cm in the central left mesentery (series 2/image 46). There is an enlarged 1.7 cm right common iliac node (series 2/image 63). There is an enlarged 1.2 cm left  common iliac node (series 2/image 57). There is an enlarged 1.5 cm right external iliac lymph node (series 2/image 60). Reproductive: Normal size prostate and seminal vesicles, with nonspecific internal prostatic calcifications. Other: No pneumoperitoneum, ascites or focal fluid collection. There is fat stranding and ill-defined fluid throughout the anterior perinephric retroperitoneal space and bilateral pelvic anterior extraperitoneal space. Musculoskeletal: No aggressive appearing focal osseous lesions. Moderate to marked degenerative changes throughout the thoracolumbar spine. Small to moderate left and small right fat containing inguinal hernias. IMPRESSION: 1. Infiltrative hypoenhancing 5.4 cm  pancreatic mass centered in the uncinate process, most in keeping with the primary pancreatic adenocarcinoma, with local vascular encasement as described. 2. Extensive retroperitoneal, mesenteric, pelvic and lower mediastinal lymphadenopathy, in keeping with nodal metastatic disease. 3. Innumerable hypodense liver masses, in keeping with widespread liver metastases. 4. Innumerable small pulmonary nodules at both lung bases, in keeping with widespread pulmonary metastases. 5. Nonspecific fat stranding and ill-defined fluid throughout the bilateral anterior retroperitoneum and anterior pelvic extraperitoneal space, cannot exclude superimposed acute pancreatitis or tumor related hemorrhage. No focal drainable fluid collection. These results were called by telephone at the time of interpretation on 04/29/2015 at 4:50 pm to Dr. Dellis Filbert TODD , who verbally acknowledged these results. Electronically Signed   By: Ilona Sorrel M.D.   On: 04/29/2015 16:58    Labs:  CBC:  Recent Labs  04/29/15 0954 05/02/15 0830  WBC 20.6* 16.9*  HGB 13.0 11.6*  HCT 39.0 34.3*  PLT 271 274    COAGS:  Recent Labs  05/02/15 0830  INR 1.10    BMP:  Recent Labs  04/29/15 0954 05/02/15 0830  NA 136 137  K 4.8 4.5  CL 103 104  CO2 22 23  GLUCOSE 97 103*  BUN 17 21*  CALCIUM 8.9 8.6*  CREATININE 1.02 0.85  GFRNONAA >60 >60  GFRAA >60 >60    LIVER FUNCTION TESTS:  Recent Labs  04/29/15 0954 05/02/15 0830  BILITOT 1.6* 2.2*  AST 76* 71*  ALT 66* 59  ALKPHOS 378* 388*  PROT 7.2 6.8  ALBUMIN 2.5* 2.5*    TUMOR MARKERS: No results for input(s): AFPTM, CEA, CA199, CHROMGRNA in the last 8760 hours.  Assessment and Plan:  70 y.o. male with history of chronic back pain, recently noted cramping abdominal pain, weight loss, diarrhea, dark urine, anorexia and recent imaging studies revealing pancreatic mass with extensive retroperitoneal, mesenteric ,pelvic, lower mediastinal lymphadenopathy, liver  lesions, and pulmonary nodules. He presents today for ultrasound-guided liver lesion biopsy for further evaluation.Risks and benefits discussed with the patient/wife including, but not limited to bleeding, infection, damage to adjacent structures or low yield requiring additional tests.All of the patient's questions were answered, patient is agreeable to proceed.Consent signed and in chart.     Thank you for this interesting consult.  I greatly enjoyed meeting IRVAN VAILLANCOURT and look forward to participating in their care.  A copy of this report was sent to the requesting provider on this date.  Electronically Signed: D. Rowe Robert 05/02/2015, 9:08 AM   I spent a total of 15 minutes in face to face in clinical consultation, greater than 50% of which was counseling/coordinating care for ultrasound-guided liver lesion biopsy

## 2015-05-03 ENCOUNTER — Telehealth: Payer: Self-pay | Admitting: Oncology

## 2015-05-03 ENCOUNTER — Ambulatory Visit (HOSPITAL_BASED_OUTPATIENT_CLINIC_OR_DEPARTMENT_OTHER): Payer: 59 | Admitting: Oncology

## 2015-05-03 VITALS — BP 123/69 | HR 99 | Temp 99.2°F | Resp 18 | Ht 71.0 in | Wt 197.1 lb

## 2015-05-03 DIAGNOSIS — R197 Diarrhea, unspecified: Secondary | ICD-10-CM

## 2015-05-03 DIAGNOSIS — B370 Candidal stomatitis: Secondary | ICD-10-CM

## 2015-05-03 DIAGNOSIS — C250 Malignant neoplasm of head of pancreas: Secondary | ICD-10-CM

## 2015-05-03 DIAGNOSIS — R634 Abnormal weight loss: Secondary | ICD-10-CM

## 2015-05-03 DIAGNOSIS — R630 Anorexia: Secondary | ICD-10-CM | POA: Diagnosis not present

## 2015-05-03 DIAGNOSIS — C787 Secondary malignant neoplasm of liver and intrahepatic bile duct: Secondary | ICD-10-CM

## 2015-05-03 DIAGNOSIS — M549 Dorsalgia, unspecified: Secondary | ICD-10-CM

## 2015-05-03 DIAGNOSIS — R109 Unspecified abdominal pain: Secondary | ICD-10-CM

## 2015-05-03 LAB — CANCER ANTIGEN 19-9: CA 19-9: 93741 U/mL — ABNORMAL HIGH (ref 0–35)

## 2015-05-03 MED ORDER — FLUCONAZOLE 100 MG PO TABS: 100.0000 mg | ORAL_TABLET | Freq: Every day | ORAL | Status: DC

## 2015-05-03 MED FILL — FLUCONAZOLE 100 MG TABLET: 100 | 5 days supply | Qty: 5 | Fill #0

## 2015-05-03 NOTE — Progress Notes (Signed)
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 He underwent an ultrasound-guided biopsy of a right liver lesion on 05/02/2015. He tolerated procedure well. He noted some discomfort in his right shoulder after the fentanyl wore off. He reports hydrocodone helps the abdominal pain. He is using Boost as a nutrition supplement. The diarrhea has improved with pancreatic enzyme replacement.  He has developed source at the lower lip.   I reviewed the biopsy slides in pathology this morning. Initial review of the histology is consistent with adenocarcinoma.    Objective:  Vital signs in last 24 hours:  Blood pressure 123/69, pulse 99, temperature 99.2 F (37.3 C), temperature source Oral, resp. rate 18, height 5\' 11"  (1.803 m), weight 197 lb 1.6 oz (89.404 kg), SpO2 100 %.    HEENT: Mild thrush at the buccal mucosa, tongue, and lower inner lip. No discrete ulcers. Resp: Lungs clear bilaterally Cardio: Regular rate and rhythm GI: Soft, the liver edge is palpable in the right mid abdomen Vascular: No leg edema   Lab Results:  Lab Results  Component Value Date   WBC 16.9* 05/02/2015   HGB 11.6* 05/02/2015   HCT 34.3* 05/02/2015   MCV 98.8 05/02/2015   PLT 274 05/02/2015   NEUTROABS 13.4* 05/02/2015   BUN 21, creatinine 0.85, alkaline phosphatase 388, albumin 2.5, AST 71, ALT 59, bilirubin 2.2   Imaging:  Ct Abdomen Pelvis W Contrast  04/29/2015  CLINICAL DATA:  70 year old male with fatigue, 35 pound weight loss and abdominal pain over the last 6 months. EXAM: CT ABDOMEN AND PELVIS WITH CONTRAST TECHNIQUE: Multidetector CT imaging of the abdomen and pelvis was performed using the standard protocol following bolus administration of intravenous contrast. CONTRAST:  138mL OMNIPAQUE IOHEXOL 300 MG/ML  SOLN COMPARISON:  None. FINDINGS: Lower chest: There are innumerable small irregular pulmonary  nodules at both lung bases (greater than 30), largest 1.0 cm in the basilar right lower lobe (series 3/image 16). There is a mildly enlarged 0.9 cm right pericardiophrenic lymph node (series 2/ image 6). Hepatobiliary: There are innumerable (> than 25) hypodense confluent liver masses of various sizes scattered throughout the liver, with representative liver masses as follows: - segment 2 left liver lobe 3.9 x 3.8 cm mass (series 2/ image 17) - segment 7 right liver lobe 6.6 x 5.2 cm mass (series 2/ image 23) - segments 4a/8 5.2 x 3.9 cm liver mass (series 2/image 18) Normal gallbladder with no radiopaque cholelithiasis. No intrahepatic biliary ductal dilatation. Minimally dilated common bile duct (7 mm diameter). Pancreas: There is an infiltrative hypoenhancing 5.4 x 2.9 x 4.8 cm pancreatic mass centered in the uncinate process of the pancreas (series 2/ image 33 and series 5/ image 71). This pancreatic mass encases at least 80% of the SMA circumference. This pancreatic mass encases approximately 50% of the SMV circumference, with associated high-grade narrowing of the SMV (series 5/ image 66). No definite main portal vein encasement. Spleen: Normal size. No mass. Adrenals/Urinary Tract: Normal adrenals. Simple 2.5 cm medial upper right renal cyst. Otherwise normal kidneys, with no hydronephrosis. Normal bladder. Stomach/Bowel: Grossly normal stomach. Normal caliber small bowel with no small bowel wall thickening. Normal diminutive appendix. Normal large bowel with no diverticulosis, large bowel wall thickening or pericolonic fat stranding. Vascular/Lymphatic: Atherosclerotic nonaneurysmal abdominal aorta. Splenic and portal veins remain patent. Patent renal veins. There is mass-effect on the hepatic veins by surrounding liver masses, and the  hepatic veins appear to remain patent. There is para celiac, porta hepatis, portacaval, aortocaval and left para-aortic lymphadenopathy. For example, there are enlarged 2.4 cm  porta hepatis (series 2/ image 24), 2.4 cm portacaval (series 2/image 20) and 1.7 cm left periaortic (series 2/ image 37) lymph nodes. There a few scattered mildly enlarged mesenteric lymph nodes, largest 1.0 cm in the central left mesentery (series 2/image 46). There is an enlarged 1.7 cm right common iliac node (series 2/image 63). There is an enlarged 1.2 cm left common iliac node (series 2/image 57). There is an enlarged 1.5 cm right external iliac lymph node (series 2/image 60). Reproductive: Normal size prostate and seminal vesicles, with nonspecific internal prostatic calcifications. Other: No pneumoperitoneum, ascites or focal fluid collection. There is fat stranding and ill-defined fluid throughout the anterior perinephric retroperitoneal space and bilateral pelvic anterior extraperitoneal space. Musculoskeletal: No aggressive appearing focal osseous lesions. Moderate to marked degenerative changes throughout the thoracolumbar spine. Small to moderate left and small right fat containing inguinal hernias. IMPRESSION: 1. Infiltrative hypoenhancing 5.4 cm pancreatic mass centered in the uncinate process, most in keeping with the primary pancreatic adenocarcinoma, with local vascular encasement as described. 2. Extensive retroperitoneal, mesenteric, pelvic and lower mediastinal lymphadenopathy, in keeping with nodal metastatic disease. 3. Innumerable hypodense liver masses, in keeping with widespread liver metastases. 4. Innumerable small pulmonary nodules at both lung bases, in keeping with widespread pulmonary metastases. 5. Nonspecific fat stranding and ill-defined fluid throughout the bilateral anterior retroperitoneum and anterior pelvic extraperitoneal space, cannot exclude superimposed acute pancreatitis or tumor related hemorrhage. No focal drainable fluid collection. These results were called by telephone at the time of interpretation on 04/29/2015 at 4:50 pm to Dr. Dellis Filbert TODD , who verbally  acknowledged these results. Electronically Signed   By: Ilona Sorrel M.D.   On: 04/29/2015 16:58   US Biopsy  05/02/2015  CLINICAL DATA:  70 year old male with newly diagnosed pancreatic mass and multiple hepatic lesions. The imaging findings are highly concerning for primary stage IV pancreatic adenocarcinoma. Patient presents for ultrasound-guided core biopsy to confirm tissue diagnosis. EXAM: ULTRASOUND BIOPSY CORE LIVER Date: 05/02/2015 PROCEDURE: 1. Ultrasound-guided core biopsy of the liver Interventional Radiologist:  Criselda Peaches, MD ANESTHESIA/SEDATION: Moderate (conscious) sedation was used. 3 mg Versed, 100 mcg Fentanyl were administered intravenously. The patient's vital signs were monitored continuously by radiology nursing throughout the procedure. Sedation Time: 19 minutes MEDICATIONS: None additional TECHNIQUE: Informed consent was obtained from the patient following explanation of the procedure, risks, benefits and alternatives. The patient understands, agrees and consents for the procedure. All questions were addressed. A time out was performed. The right upper quadrant was interrogated with ultrasound. The liver is diffusely heterogeneous with multiple ill-defined masses. A suitable mass within hepatic segment 5 was identified. A suitable skin entry site was selected and marked. The region was then sterilely prepped and draped in standard fashion using chlorhexidine skin prep. Local anesthesia was attained by infiltration with 1% lidocaine. A small dermatotomy was made. Under real-time sonographic guidance, a 17 gauge trocar needle was advanced to the margin of the mass. Multiple 18 gauge core biopsies were then coaxially obtained. Needle placement was confirmed on all biopsy passes with real-time sonography. Biopsy specimens were placed in formalin and delivered to pathology for further analysis. As the introducer needle was removed, the biopsy tract was embolized with a Gel-Foam slurry.  Post biopsy ultrasound imaging demonstrates no active bleeding or perihepatic hematoma. The patient tolerated the procedure well. COMPLICATIONS: None. Estimated  blood loss: 0 IMPRESSION: Technically successful ultrasound-guided core biopsy of ill-defined lesion in the right hepatic lobe. Signed, Criselda Peaches, MD [facility] Electronically Signed   By: Jacqulynn Cadet M.D.   On: 05/02/2015 12:31    Medications: I have reviewed the patient's current medications.  Assessment/Plan: 1. Pancreas cancer, stage IV  CT abdomen/pelvis 04/29/2015 revealed a pancreas uncinate mass, liver metastases, lung nodules, extensive retroperitoneal/mesenteric, pelvic, and low mediastinal lymphadenopathy  Ultrasound-guided biopsy of a right liver lesion 05/02/2015 confirmed metastatic adenocarcinoma  2. Anorexia/weight loss  3. Abdominal/back pain secondary to the pancreas mass and liver metastases  4. History of atrial fibrillation and flutter  5. [place] likely secondary to pancreatic insufficiency, improved with pancreatic enzyme replacement  6.    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 We discussed treatment options again today. We discussed FOLFIRINOX and gemcitabine/Abraxane chemotherapy. He agrees that it would be difficult for him to tolerate FOLFIRINOX. We decided to proceed with gemcitabine/Abraxane.  I reviewed the potential toxicities associated with this chemotherapy regimen including the chance for nausea/vomiting, mucositis, diarrhea, alopecia, and hematologic toxicity. We discussed the rash, fever, and pneumonitis associated with gemcitabine. We reviewed the allergic reaction and neuropathy seen with Abraxane. He agrees to proceed. He received chemotherapy teaching by myself and an RN today.  The plan is to  deliver chemotherapy on a day 1, day 8 schedule every 3 weeks as tolerated. A first treatment is scheduled for 05/07/2015 after placement of a Port-A-Cath the same day. He will return for an office visit and day 8 chemotherapy 05/14/2015.  Betsy Coder, MD  05/03/2015  11:47 AM

## 2015-05-03 NOTE — Telephone Encounter (Signed)
Additional appointments added per pof and patient will get a new avs 1/16

## 2015-05-05 ENCOUNTER — Other Ambulatory Visit: Payer: Self-pay | Admitting: Oncology

## 2015-05-06 ENCOUNTER — Encounter: Payer: Self-pay | Admitting: Oncology

## 2015-05-06 ENCOUNTER — Ambulatory Visit: Payer: 59 | Admitting: Nutrition

## 2015-05-06 NOTE — Progress Notes (Signed)
Pt is approved with [employer] for Abraxane from 05/06/15 to 04/19/16 for $6,000. I will send copy of approval letter and card to Arline Asp in billing once pt receives his card.

## 2015-05-06 NOTE — Progress Notes (Signed)
70 year old male diagnosed with cancer of the pancreas.  He is a patient of Dr. Julieanne Manson.  Past medical history includes degenerative disc disease and atrial fibrillation.  Medications include Xanax, omega-3 fatty acids, Creon, Diflucan, multivitamin, and Compazine.  Labs include glucose 103, BUN 21, and albumin 2.5.  Height: 5 feet 11 inches. Weight: 197 pounds. Usual body weight: 227 pounds. BMI: 27.5.  Patient endorses 30 pound weight loss from usual body weight. He reports weight is currently stable. His pain is controlled. He denies nausea vomiting at this time. Diarrhea has improved on Creon. Patient consumes 3 meals daily and tries to drink 2 boost between meals.  Nutrition diagnosis:  Unintended weight loss related to new diagnosis of pancreas cancer as evidenced by 13% weight loss from usual body weight.  Intervention:  Patient educated to consume smaller more frequent meals with protein 6 times a day providing weight maintenance. Reviewed high protein foods and provided fact sheet on increasing calories and protein. Reviewed basic strategies for eating if he develops nausea and vomiting and provided fact sheet Recommended patient continue oral nutrition supplements twice a day and provided additional samples. Questions were answered.  Teach back method used.  Contact information was given.  Monitoring, evaluation, goals: Patient will tolerate adequate calories and protein to promote weight maintenance.  Next visit: Tuesday, January 31 during infusion.  **Disclaimer: This note was dictated with voice recognition software. Similar sounding words can inadvertently be transcribed and this note may contain transcription errors which may not have been corrected upon publication of note.**

## 2015-05-07 ENCOUNTER — Ambulatory Visit (HOSPITAL_COMMUNITY)
Admission: RE | Admit: 2015-05-07 | Discharge: 2015-05-07 | Disposition: A | Discharge status: Home / Self Care | Payer: 59 | Source: Ambulatory Visit | Attending: Oncology | Admitting: Oncology

## 2015-05-07 ENCOUNTER — Encounter: Payer: Self-pay | Admitting: *Deleted

## 2015-05-07 ENCOUNTER — Other Ambulatory Visit: Payer: Self-pay | Admitting: Oncology

## 2015-05-07 ENCOUNTER — Encounter (HOSPITAL_COMMUNITY): Payer: Self-pay

## 2015-05-07 ENCOUNTER — Ambulatory Visit (HOSPITAL_BASED_OUTPATIENT_CLINIC_OR_DEPARTMENT_OTHER): Payer: 59

## 2015-05-07 VITALS — BP 113/62 | HR 68 | Temp 97.5°F | Resp 16

## 2015-05-07 DIAGNOSIS — C259 Malignant neoplasm of pancreas, unspecified: Secondary | ICD-10-CM | POA: Diagnosis not present

## 2015-05-07 DIAGNOSIS — G8929 Other chronic pain: Secondary | ICD-10-CM | POA: Diagnosis not present

## 2015-05-07 DIAGNOSIS — M549 Dorsalgia, unspecified: Secondary | ICD-10-CM | POA: Diagnosis not present

## 2015-05-07 DIAGNOSIS — Z7982 Long term (current) use of aspirin: Secondary | ICD-10-CM | POA: Diagnosis not present

## 2015-05-07 DIAGNOSIS — Z452 Encounter for adjustment and management of vascular access device: Secondary | ICD-10-CM | POA: Diagnosis not present

## 2015-05-07 DIAGNOSIS — C250 Malignant neoplasm of head of pancreas: Secondary | ICD-10-CM | POA: Diagnosis not present

## 2015-05-07 DIAGNOSIS — K8689 Other specified diseases of pancreas: Secondary | ICD-10-CM

## 2015-05-07 DIAGNOSIS — Z5111 Encounter for antineoplastic chemotherapy: Secondary | ICD-10-CM | POA: Diagnosis not present

## 2015-05-07 LAB — CBC WITH DIFFERENTIAL/PLATELET
BASOS ABS: 0 10*3/uL (ref 0.0–0.1)
Basophils Relative: 0 %
Eosinophils Absolute: 0.1 10*3/uL (ref 0.0–0.7)
Eosinophils Relative: 1 %
HEMATOCRIT: 33.9 % — AB (ref 39.0–52.0)
Hemoglobin: 11.3 g/dL — ABNORMAL LOW (ref 13.0–17.0)
LYMPHS PCT: 12 %
Lymphs Abs: 2.1 10*3/uL (ref 0.7–4.0)
MCH: 32.8 pg (ref 26.0–34.0)
MCHC: 33.3 g/dL (ref 30.0–36.0)
MCV: 98.5 fL (ref 78.0–100.0)
MONO ABS: 1.5 10*3/uL — AB (ref 0.1–1.0)
MONOS PCT: 8 %
NEUTROS ABS: 14.1 10*3/uL — AB (ref 1.7–7.7)
Neutrophils Relative %: 79 %
Platelets: 291 10*3/uL (ref 150–400)
RBC: 3.44 MIL/uL — ABNORMAL LOW (ref 4.22–5.81)
RDW: 14.4 % (ref 11.5–15.5)
WBC: 17.8 10*3/uL — ABNORMAL HIGH (ref 4.0–10.5)

## 2015-05-07 LAB — PROTIME-INR
INR: 1.22 (ref 0.00–1.49)
Prothrombin Time: 15.5 seconds — ABNORMAL HIGH (ref 11.6–15.2)

## 2015-05-07 LAB — GLUCOSE, CAPILLARY: GLUCOSE-CAPILLARY: 117 mg/dL — AB (ref 65–99)

## 2015-05-07 LAB — APTT: APTT: 30 s (ref 24–37)

## 2015-05-07 MED ORDER — SODIUM CHLORIDE 0.9 % IJ SOLN
10.0000 mL | INTRAMUSCULAR | Status: DC | PRN
  Filled 2015-05-07: qty 10

## 2015-05-07 MED ORDER — SODIUM CHLORIDE 0.9 % IV SOLN
10.0000 mg | Freq: Once | INTRAVENOUS | Status: AC
  Administered 2015-05-07: 10 mg via INTRAVENOUS
  Filled 2015-05-07: qty 1

## 2015-05-07 MED ORDER — SODIUM CHLORIDE 0.9 % IV SOLN
INTRAVENOUS | Status: DC
  Administered 2015-05-07: 09:00:00 via INTRAVENOUS

## 2015-05-07 MED ORDER — MIDAZOLAM HCL 2 MG/2ML IJ SOLN
INTRAMUSCULAR | Status: AC | PRN
  Administered 2015-05-07 (×4): 1 mg via INTRAVENOUS

## 2015-05-07 MED ORDER — FENTANYL CITRATE (PF) 100 MCG/2ML IJ SOLN
INTRAMUSCULAR | Status: AC
  Filled 2015-05-07: qty 4

## 2015-05-07 MED ORDER — LIDOCAINE HCL 1 % IJ SOLN
INTRAMUSCULAR | Status: AC
  Filled 2015-05-07: qty 20

## 2015-05-07 MED ORDER — PALONOSETRON HCL INJECTION 0.25 MG/5ML
INTRAVENOUS | Status: AC
  Filled 2015-05-07: qty 5

## 2015-05-07 MED ORDER — PACLITAXEL PROTEIN-BOUND CHEMO INJECTION 100 MG
100.0000 mg/m2 | Freq: Once | INTRAVENOUS | Status: AC
  Administered 2015-05-07: 200 mg via INTRAVENOUS
  Filled 2015-05-07: qty 40

## 2015-05-07 MED ORDER — MIDAZOLAM HCL 2 MG/2ML IJ SOLN
INTRAMUSCULAR | Status: AC
  Filled 2015-05-07: qty 6

## 2015-05-07 MED ORDER — FENTANYL CITRATE (PF) 100 MCG/2ML IJ SOLN
INTRAMUSCULAR | Status: AC | PRN
  Administered 2015-05-07: 50 ug via INTRAVENOUS

## 2015-05-07 MED ORDER — HEPARIN SOD (PORK) LOCK FLUSH 100 UNIT/ML IV SOLN
500.0000 [IU] | Freq: Once | INTRAVENOUS | Status: DC | PRN
  Filled 2015-05-07: qty 5

## 2015-05-07 MED ORDER — CEFAZOLIN SODIUM-DEXTROSE 2-3 GM-% IV SOLR
INTRAVENOUS | Status: AC
  Administered 2015-05-07: 2000 mg
  Filled 2015-05-07: qty 50

## 2015-05-07 MED ORDER — SODIUM CHLORIDE 0.9 % IV SOLN
800.0000 mg/m2 | Freq: Once | INTRAVENOUS | Status: AC
  Administered 2015-05-07: 1710 mg via INTRAVENOUS
  Filled 2015-05-07: qty 44.97

## 2015-05-07 MED ORDER — PALONOSETRON HCL INJECTION 0.25 MG/5ML
0.2500 mg | Freq: Once | INTRAVENOUS | Status: AC
  Administered 2015-05-07: 0.25 mg via INTRAVENOUS

## 2015-05-07 MED ORDER — HEPARIN SOD (PORK) LOCK FLUSH 100 UNIT/ML IV SOLN
INTRAVENOUS | Status: AC
  Filled 2015-05-07: qty 5

## 2015-05-07 MED ORDER — LIDOCAINE-EPINEPHRINE 2 %-1:100000 IJ SOLN
INTRAMUSCULAR | Status: AC
  Filled 2015-05-07: qty 1

## 2015-05-07 MED ORDER — SODIUM CHLORIDE 0.9 % IV SOLN
Freq: Once | INTRAVENOUS | Status: AC
  Administered 2015-05-07: 13:00:00 via INTRAVENOUS

## 2015-05-07 MED ORDER — HYDROCODONE-ACETAMINOPHEN 5-325 MG PO TABS
1.0000 | ORAL_TABLET | Freq: Once | ORAL | Status: AC
  Administered 2015-05-07: 1 via ORAL
  Filled 2015-05-07: qty 1

## 2015-05-07 NOTE — Sedation Documentation (Signed)
Resting with eyes closed.  Does not appear to be in distress.

## 2015-05-07 NOTE — Patient Instructions (Signed)
[facility] Discharge Instructions for Patients Receiving Chemotherapy  Today you received the following chemotherapy agents Gemzar and Abraxane.  To help prevent nausea and vomiting after your treatment, we encourage you to take your nausea medication.   If you develop nausea and vomiting that is not controlled by your nausea medication, call the clinic.   BELOW ARE SYMPTOMS THAT SHOULD BE REPORTED IMMEDIATELY:  *FEVER GREATER THAN 100.5 F  *CHILLS WITH OR WITHOUT FEVER  NAUSEA AND VOMITING THAT IS NOT CONTROLLED WITH YOUR NAUSEA MEDICATION  *UNUSUAL SHORTNESS OF BREATH  *UNUSUAL BRUISING OR BLEEDING  TENDERNESS IN MOUTH AND THROAT WITH OR WITHOUT PRESENCE OF ULCERS  *URINARY PROBLEMS  *BOWEL PROBLEMS  UNUSUAL RASH Items with * indicate a potential emergency and should be followed up as soon as possible.  Feel free to call the clinic you have any questions or concerns. The clinic phone number is [phone].  Please show the CHEMO ALERT CARD at check-in to the Emergency Department and triage nurse.   

## 2015-05-07 NOTE — H&P (Signed)
HPI:  The patient has had a H&P performed within the last 30 days, all history, medications, and exam have been reviewed. The patient denies any interval changes since the H&P.  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   He underwent an ultrasound-guided liver lesion biopsy on 05/02/2015 by Dr. Laurence Ferrari. Path results showed adenocarcinoma, favor pancreaticobiliary origin.  He is here today for placement of a Port A Cath  He is NPO.  He takes aspirin daily.  He denies any fever, chills, cough, illness, or urinary symptoms.  Medications: Prior to Admission medications   Medication Sig Start Date End Date Taking? Authorizing Provider  ALPRAZolam Duanne Moron) 0.5 MG tablet Take 0.5 mg by mouth daily as needed. 04/05/15   Historical Provider, MD  aspirin 325 MG tablet Take 325 mg by mouth daily.    Historical Provider, MD  CIALIS 5 MG tablet Take 5 mg by mouth as needed. 01/22/15   Historical Provider, MD  fish oil-omega-3 fatty acids 1000 MG capsule Take 1 g by mouth daily.    Historical Provider, MD  flecainide (TAMBOCOR) 100 MG tablet TAKE 1 TABLET BY MOUTH TWICE DAILY 02/22/15   Evans Lance, MD  fluconazole (DIFLUCAN) 100 MG tablet Take 1 tablet (100 mg total) by mouth daily. 05/03/15   Ladell Pier, MD  HYDROcodone-acetaminophen (NORCO/VICODIN) 5-325 MG tablet Take 1 tablet by mouth every 4 (four) hours as needed. 04/30/15   Ladell Pier, MD  ibuprofen (ADVIL,MOTRIN) 200 MG tablet Take 200 mg by mouth every 6 (six) hours as needed.    Historical Provider, MD  lidocaine-prilocaine (EMLA) cream Place small amount over port area 1-2 hours prior to treatment and cover with plastic wrap.  DO NOT RUB IN  05/02/15   Ladell Pier, MD  lipase/protease/amylase (CREON) 36000 UNITS CPEP capsule Take 2 capsules (72,000 Units total) by mouth 3 (three) times daily before meals. 04/30/15   Ladell Pier, MD  metoprolol tartrate (LOPRESSOR) 25 MG tablet TAKE 1 TABLET BY MOUTH TWICE A DAY 02/27/14   Evans Lance, MD  Multiple Vitamin (MULTIVITAMIN) tablet Take 1 tablet by mouth daily.    Historical Provider, MD  prochlorperazine (COMPAZINE) 10 MG tablet Take 1 tablet (10 mg total) by mouth every 6 (six) hours as needed for nausea or vomiting. 05/02/15   Ladell Pier, MD     Vital Signs: 117/70 Pulse 82 Temp 96.5  Physical Exam  Eyes: Scleral icterus is present.  Constitutional: He is oriented to person, place, and time. He appears well-developed and well-nourished.  Cardiovascular: Normal rate and regular rhythm.  Pulmonary/Chest: Effort normal and breath sounds normal.  Abdominal: Soft. Bowel sounds are normal. There is tenderness.  Musculoskeletal: Normal range of motion. He exhibits no edema.  Neurological: He is alert and oriented to person, place, and time.  Skin:  Slightly jaundiced  Mallampati Score:  MD Evaluation Airway: WNL Heart: WNL Abdomen: WNL Chest/ Lungs: WNL ASA  Classification: 3 Mallampati/Airway Score: One  Labs:  CBC:  Recent Labs  04/29/15 0954 05/02/15 0830 05/07/15 0820  WBC 20.6* 16.9* 17.8*  HGB 13.0 11.6* 11.3*  HCT 39.0 34.3* 33.9*  PLT 271 274 291    COAGS:  Recent Labs  05/02/15 0830 05/07/15 0820  INR  1.10 1.22  APTT  --  30    BMP:  Recent Labs  04/29/15 0954 05/02/15 0830  NA 136 137  K 4.8 4.5  CL 103 104  CO2 22 23  GLUCOSE 97 103*  BUN 17 21*  CALCIUM 8.9 8.6*  CREATININE 1.02 0.85  GFRNONAA >60 >60  GFRAA >60 >60    LIVER FUNCTION TESTS:  Recent Labs  04/29/15 0954 05/02/15 0830  BILITOT 1.6* 2.2*  AST 76* 71*  ALT 66* 59  ALKPHOS 378* 388*  PROT 7.2 6.8  ALBUMIN 2.5* 2.5*    Assessment/Plan:    Pancreatic Cancer  Will proceed with placement of Port A cath today by Dr. Barbie Banner.  Risks and Benefits discussed with the patient including, but not limited to bleeding, infection, pneumothorax, or fibrin sheath development and need for additional procedures.  All of the patient's questions were answered, patient is agreeable to proceed. Consent signed and in chart.   Signed: Murrell Redden PA-C 05/07/2015, 8:49 AM

## 2015-05-07 NOTE — Discharge Instructions (Signed)
Implanted Port Insertion, Care After Refer to this sheet in the next few weeks. These instructions provide you with information on caring for yourself after your procedure. Your health care provider may also give you more specific instructions. Your treatment has been planned according to current medical practices, but problems sometimes occur. Call your health care provider if you have any problems or questions after your procedure. WHAT TO EXPECT AFTER THE PROCEDURE After your procedure, it is typical to have the following:   Discomfort at the port insertion site. Ice packs to the area will help.  Bruising on the skin over the port. This will subside in 3-4 days. HOME CARE INSTRUCTIONS  After your port is placed, you will get a manufacturer's information card. The card has information about your port. Keep this card with you at all times.   Know what kind of port you have. There are many types of ports available.   Wear a medical alert bracelet in case of an emergency. This can help alert health care workers that you have a port.   The port can stay in for as long as your health care provider believes it is necessary.   A home health care nurse may give medicines and take care of the port.   You or a family member can get special training and directions for giving medicine and taking care of the port at home.  SEEK MEDICAL CARE IF:   Your port does not flush or you are unable to get a blood return.   You have a fever or chills. SEEK IMMEDIATE MEDICAL CARE IF:  You have new fluid or pus coming from your incision.   You notice a bad smell coming from your incision site.   You have swelling, pain, or more redness at the incision or port site.   You have chest pain or shortness of breath.   This information is not intended to replace advice given to you by your health care provider. Make sure you discuss any questions you have with your health care provider.   Document  Released: 01/25/2013 Document Revised: 04/11/2013 Document Reviewed: 01/25/2013 Elsevier Interactive Patient Education 2016 Elsevier Inc.  Moderate Conscious Sedation, Adult Sedation is the use of medicines to promote relaxation and relieve discomfort and anxiety. Moderate conscious sedation is a type of sedation. Under moderate conscious sedation you are less alert than normal but are still able to respond to instructions or stimulation. Moderate conscious sedation is used during short medical and dental procedures. It is milder than deep sedation or general anesthesia and allows you to return to your regular activities sooner. LET [facility] CARE PROVIDER KNOW ABOUT:   Any allergies you have.  All medicines you are taking, including vitamins, herbs, eye drops, creams, and over-the-counter medicines.  Use of steroids (by mouth or creams).  Previous problems you or members of your family have had with the use of anesthetics.  Any blood disorders you have.  Previous surgeries you have had.  Medical conditions you have.  Possibility of pregnancy, if this applies.  Use of cigarettes, alcohol, or illegal drugs. RISKS AND COMPLICATIONS Generally, this is a safe procedure. However, as with any procedure, problems can occur. Possible problems include:  Oversedation.  Trouble breathing on your own. You may need to have a breathing tube until you are awake and breathing on your own.  Allergic reaction to any of the medicines used for the procedure. BEFORE THE PROCEDURE  You may have blood  tests done. These tests can help show how well your kidneys and liver are working. They can also show how well your blood clots.  A physical exam will be done.  Only take medicines as directed by your health care provider. You may need to stop taking medicines (such as blood thinners, aspirin, or nonsteroidal anti-inflammatory drugs) before the procedure.   Do not eat or drink at least 6 hours  before the procedure or as directed by your health care provider.  Arrange for a responsible adult, family member, or friend to take you home after the procedure. He or she should stay with you for at least 24 hours after the procedure, until the medicine has worn off. PROCEDURE   An intravenous (IV) catheter will be inserted into one of your veins. Medicine will be able to flow directly into your body through this catheter. You may be given medicine through this tube to help prevent pain and help you relax.  The medical or dental procedure will be done. AFTER THE PROCEDURE  You will stay in a recovery area until the medicine has worn off. Your blood pressure and pulse will be checked.   Depending on the procedure you had, you may be allowed to go home when you can tolerate liquids and your pain is under control.   This information is not intended to replace advice given to you by your health care provider. Make sure you discuss any questions you have with your health care provider.   Document Released: 12/30/2000 Document Revised: 04/27/2014 Document Reviewed: 12/12/2012 Elsevier Interactive Patient Education [employer].

## 2015-05-07 NOTE — Progress Notes (Signed)
Oncology Nurse Navigator Documentation  Oncology Nurse Navigator Flowsheets 05/07/2015  Navigator Location [facility]  Navigator Encounter Type Treatment  Telephone -  Abnormal Finding Date -  Treatment Initiated Date 05/07/2015  Patient Visit Type MedOnc  Treatment Phase First Chemo Tx: Abraxane and Gemzar  Barriers/Navigation Needs Coordination of Care  Education -  Interventions Coordination of Care  Referrals -  Coordination of Care Other--arrange for path tissue to be sent to [place] per patient request.  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 Horris Latino to receive his path tissue and run some molecular testing. After several calls to [facility] obtained the following address: [address] [place], [place] [address] Contact [phone] Faxed info to [facility] for tissue to be sent as requested on case [id number]

## 2015-05-07 NOTE — Procedures (Signed)
RIJV PAC SVC RA No comp/EBL 

## 2015-05-08 ENCOUNTER — Other Ambulatory Visit (HOSPITAL_COMMUNITY): Payer: 59

## 2015-05-09 ENCOUNTER — Telehealth: Payer: Self-pay | Admitting: *Deleted

## 2015-05-09 ENCOUNTER — Other Ambulatory Visit: Payer: Self-pay | Admitting: *Deleted

## 2015-05-09 MED ORDER — METOPROLOL TARTRATE 25 MG PO TABS: 25.0000 mg | ORAL_TABLET | Freq: Two times a day (BID) | ORAL | Status: AC

## 2015-05-09 MED ORDER — FLECAINIDE ACETATE 100 MG PO TABS: 100.0000 mg | ORAL_TABLET | Freq: Two times a day (BID) | ORAL | Status: AC

## 2015-05-09 MED FILL — METOPROLOL TARTRATE 25 MG T: 25 | 90 days supply | Qty: 180 | Fill #0

## 2015-05-09 MED FILL — FLECAINIDE ACETATE 100 MG T: 100 | 30 days supply | Qty: 60 | Fill #0

## 2015-05-09 NOTE — Telephone Encounter (Signed)
Called pt to review side effect management. He denied any issues. Encouraged him to call if needs arise.

## 2015-05-09 NOTE — Telephone Encounter (Signed)
Refilled per Dr Lovena Le

## 2015-05-14 ENCOUNTER — Ambulatory Visit (HOSPITAL_COMMUNITY)
Admission: RE | Admit: 2015-05-14 | Discharge: 2015-05-14 | Disposition: A | Discharge status: Home / Self Care | Payer: 59 | Source: Ambulatory Visit | Attending: Oncology | Admitting: Oncology

## 2015-05-14 ENCOUNTER — Other Ambulatory Visit (HOSPITAL_BASED_OUTPATIENT_CLINIC_OR_DEPARTMENT_OTHER): Payer: 59

## 2015-05-14 ENCOUNTER — Ambulatory Visit (HOSPITAL_BASED_OUTPATIENT_CLINIC_OR_DEPARTMENT_OTHER): Payer: 59

## 2015-05-14 ENCOUNTER — Telehealth: Payer: Self-pay | Admitting: Oncology

## 2015-05-14 ENCOUNTER — Ambulatory Visit (HOSPITAL_BASED_OUTPATIENT_CLINIC_OR_DEPARTMENT_OTHER): Payer: 59 | Admitting: Oncology

## 2015-05-14 VITALS — BP 109/60 | HR 80 | Temp 97.7°F | Resp 18 | Ht 71.0 in | Wt 195.9 lb

## 2015-05-14 DIAGNOSIS — C787 Secondary malignant neoplasm of liver and intrahepatic bile duct: Secondary | ICD-10-CM | POA: Diagnosis not present

## 2015-05-14 DIAGNOSIS — C772 Secondary and unspecified malignant neoplasm of intra-abdominal lymph nodes: Secondary | ICD-10-CM | POA: Diagnosis not present

## 2015-05-14 DIAGNOSIS — C250 Malignant neoplasm of head of pancreas: Secondary | ICD-10-CM

## 2015-05-14 DIAGNOSIS — R109 Unspecified abdominal pain: Secondary | ICD-10-CM

## 2015-05-14 DIAGNOSIS — M549 Dorsalgia, unspecified: Secondary | ICD-10-CM

## 2015-05-14 DIAGNOSIS — R197 Diarrhea, unspecified: Secondary | ICD-10-CM

## 2015-05-14 DIAGNOSIS — R634 Abnormal weight loss: Secondary | ICD-10-CM | POA: Diagnosis not present

## 2015-05-14 DIAGNOSIS — C259 Malignant neoplasm of pancreas, unspecified: Secondary | ICD-10-CM | POA: Diagnosis not present

## 2015-05-14 DIAGNOSIS — R918 Other nonspecific abnormal finding of lung field: Secondary | ICD-10-CM | POA: Insufficient documentation

## 2015-05-14 DIAGNOSIS — R630 Anorexia: Secondary | ICD-10-CM | POA: Diagnosis not present

## 2015-05-14 DIAGNOSIS — R188 Other ascites: Secondary | ICD-10-CM | POA: Diagnosis not present

## 2015-05-14 DIAGNOSIS — B370 Candidal stomatitis: Secondary | ICD-10-CM

## 2015-05-14 LAB — COMPREHENSIVE METABOLIC PANEL
ALBUMIN: 1.8 g/dL — AB (ref 3.5–5.0)
ALK PHOS: 432 U/L — AB (ref 40–150)
ALT: 312 U/L (ref 0–55)
AST: 220 U/L — AB (ref 5–34)
Anion Gap: 9 mEq/L (ref 3–11)
BILIRUBIN TOTAL: 7.93 mg/dL — AB (ref 0.20–1.20)
BUN: 27.2 mg/dL — AB (ref 7.0–26.0)
CALCIUM: 8.8 mg/dL (ref 8.4–10.4)
CO2: 22 mEq/L (ref 22–29)
Chloride: 103 mEq/L (ref 98–109)
Creatinine: 0.9 mg/dL (ref 0.7–1.3)
EGFR: 88 mL/min/{1.73_m2} — ABNORMAL LOW (ref >90)
Glucose: 115 mg/dl (ref 70–140)
POTASSIUM: 4.9 meq/L (ref 3.5–5.1)
Sodium: 133 mEq/L — ABNORMAL LOW (ref 136–145)
TOTAL PROTEIN: 6.4 g/dL (ref 6.4–8.3)

## 2015-05-14 LAB — CBC WITH DIFFERENTIAL/PLATELET
BASO%: 0.3 % (ref 0.0–2.0)
BASOS ABS: 0 10*3/uL (ref 0.0–0.1)
EOS%: 0.5 % (ref 0.0–7.0)
Eosinophils Absolute: 0.1 10*3/uL (ref 0.0–0.5)
HEMATOCRIT: 31.4 % — AB (ref 38.4–49.9)
HEMOGLOBIN: 10.8 g/dL — AB (ref 13.0–17.1)
LYMPH#: 1.8 10*3/uL (ref 0.9–3.3)
LYMPH%: 16.7 % (ref 14.0–49.0)
MCH: 32.3 pg (ref 27.2–33.4)
MCHC: 34.4 g/dL (ref 32.0–36.0)
MCV: 94 fL (ref 79.3–98.0)
MONO#: 0.7 10*3/uL (ref 0.1–0.9)
MONO%: 6.4 % (ref 0.0–14.0)
NEUT#: 8.2 10*3/uL — ABNORMAL HIGH (ref 1.5–6.5)
NEUT%: 76.1 % — ABNORMAL HIGH (ref 39.0–75.0)
Platelets: 152 10*3/uL (ref 140–400)
RBC: 3.34 10*6/uL — ABNORMAL LOW (ref 4.20–5.82)
RDW: 14.3 % (ref 11.0–14.6)
WBC: 10.8 10*3/uL — ABNORMAL HIGH (ref 4.0–10.3)

## 2015-05-14 MED ORDER — IOHEXOL 300 MG/ML  SOLN
100.0000 mL | Freq: Once | INTRAMUSCULAR | Status: AC | PRN
  Administered 2015-05-14: 100 mL via INTRAVENOUS

## 2015-05-14 MED ORDER — SODIUM CHLORIDE 0.9 % IV SOLN
INTRAVENOUS | Status: DC
  Administered 2015-05-14: 13:00:00 via INTRAVENOUS

## 2015-05-14 NOTE — Progress Notes (Signed)
  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 He completed a first treatment with gemcitabine/Abraxane on 05/07/2015. He tolerated the treatment well. He noted an improved energy level for a few days following chemotherapy. No neuropathy symptoms, rash, or nausea. The abdominal pain is controlled with hydrocodone. He reports malaise for the past few days. He slept much of the day yesterday.  Objective:  Vital signs in last 24 hours:  Blood pressure 109/60, pulse 80, temperature 97.7 F (36.5 C), temperature source Oral, resp. rate 18, height 5\' 11"  (1.803 m), weight 195 lb 14.4 oz (88.86 kg), SpO2 98 %.    HEENT: No thrush or ulcers. Scleral icterus Resp: Lungs clear bilaterally Cardio: Regular rate and rhythm GI: The liver edge is palpable in the right mid abdomen. No tenderness. Probable ascites Vascular: No leg edema  Skin: Jaundice   Portacath/PICC-without erythema  Lab Results:  Lab Results  Component Value Date   WBC 10.8* 05/14/2015   HGB 10.8* 05/14/2015   HCT 31.4* 05/14/2015   MCV 94.0 05/14/2015   PLT 152 05/14/2015   NEUTROABS 8.2* 05/14/2015     Medications: I have reviewed the patient's current medications.  Assessment/Plan: 1. Pancreas cancer, stage IV  CT abdomen/pelvis 04/29/2015 revealed a pancreas uncinate mass, liver metastases, lung nodules, extensive retroperitoneal/mesenteric, pelvic, and low mediastinal lymphadenopathy  Ultrasound-guided biopsy of a right liver lesion 05/02/2015 confirmed metastatic adenocarcinoma  Cycle 1 gemcitabine/Abraxane 05/07/2015  2. Anorexia/weight loss  3. Abdominal/back pain secondary to the pancreas mass and liver metastases  4. History of atrial fibrillation and flutter  5. [place] likely secondary to pancreatic insufficiency, improved with pancreatic enzyme replacement  6. Oral candidiasis  05/03/2015-treated with Diflucan  7.    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 He has developed progressive hyperbilirubinemia. I suspect this is secondary to biliary obstruction as opposed to toxicity from chemotherapy or tumor progression in the liver.  Chemotherapy will be placed on hold today. We will schedule a CT of the abdomen for today. I discussed the case with Dr. Fuller Plan. He will follow-up on the CT result and schedule Dr. Redmond Pulling for an ERCP/stent placement as indicated.  Dr. Redmond Pulling will be scheduled for an office visit and chemotherapy in one week.  Betsy Coder, MD  05/14/2015  1:30 PM

## 2015-05-14 NOTE — Patient Instructions (Signed)

## 2015-05-14 NOTE — Telephone Encounter (Signed)
Added lab/fu for 1/31 and ct to be done today after fluids. Patient aware of ct and will be given updated schedule in inf area. Per patient he is not supposed to drink prep for the ct-abd - IV contrast only - desk nurse informed.

## 2015-05-15 ENCOUNTER — Telehealth: Payer: Self-pay | Admitting: *Deleted

## 2015-05-15 NOTE — Telephone Encounter (Signed)
  Oncology Nurse Navigator Documentation  Navigator Location: [facility] (05/15/15 1559) Navigator Encounter Type: Telephone (05/15/15 1559) Telephone: Outgoing Call (05/15/15 1559): Call returned to [facility] Pathology department that tissue block was received and they do not know what testing we want done. Made him aware that the patient had made arrangements for Dr. Manus Rudd to review and run molecular testing (they know each other). Appreciated the clarification. He will call back if any more information is needed. Made him aware that we were going to send it out for [facility], but patient wanted Dr. Manus Rudd to have the tissue.

## 2015-05-16 ENCOUNTER — Telehealth: Payer: Self-pay | Admitting: *Deleted

## 2015-05-16 NOTE — Telephone Encounter (Signed)
  Oncology Nurse Navigator Documentation  Navigator Location: [facility] (05/16/15 1833) Navigator Encounter Type: Telephone (05/16/15 1833) Telephone: Incoming Call (05/16/15 1833)  @ 1830-received VM from patient requesting refill on his Hydrocodone. Please call when ready to pick up. Message to collaborative nurse desk for tomorrow.

## 2015-05-17 ENCOUNTER — Other Ambulatory Visit: Payer: Self-pay | Admitting: *Deleted

## 2015-05-17 ENCOUNTER — Telehealth: Payer: Self-pay | Admitting: *Deleted

## 2015-05-17 DIAGNOSIS — K8689 Other specified diseases of pancreas: Secondary | ICD-10-CM

## 2015-05-17 MED ORDER — HYDROCODONE-ACETAMINOPHEN 5-325 MG PO TABS: 1.0000 | ORAL_TABLET | ORAL | Status: DC | PRN

## 2015-05-17 MED FILL — HYDROCODON-APAP 5-325: 5-325 | 12 days supply | Qty: 75 | Fill #0

## 2015-05-17 NOTE — Telephone Encounter (Signed)
Oncology Nurse Navigator Documentation  Oncology Nurse Navigator Flowsheets 05/17/2015  Navigator Location [facility]  Navigator Encounter Type Telephone  Telephone Outgoing Call  Abnormal Finding Date -  Treatment Initiated Date -  Patient Visit Type -  Treatment Phase -  Barriers/Navigation Needs -  Education -  Interventions Coordination of Care--Called [facility] path and requested tissue be returned so Dr. Benay Spice can send off for [facility] Testing  Referrals   [place] with Patient 15  Call from [facility] Pathology Etheleen Mayhew [phone]) saying Dr. Michaelene Song remembers no conversation about reviewing or doing any testing on tissue. Spoke with patient and he tells RN that he did not discuss this w/DR. Sharpless-thought Dr. Benay Spice did. Informed him the [facility] Testing could be ordered from [facility] if we get it back. Would be easier for Dr. Benay Spice to get results if he was the ordering physician. He requested return of tissue to [facility] and requested return. Should arrive next week. Upon arrival will request [facility] Testing.

## 2015-05-17 NOTE — Telephone Encounter (Signed)
Returned call to pt, he reports he takes 4-6 Hydrocodone per day. Current dose is adequate per pt. Rx will be left in prescription book for pick up.

## 2015-05-21 ENCOUNTER — Ambulatory Visit: Payer: 59

## 2015-05-21 ENCOUNTER — Ambulatory Visit: Payer: 59 | Admitting: Nutrition

## 2015-05-21 ENCOUNTER — Telehealth: Payer: Self-pay | Admitting: Oncology

## 2015-05-21 ENCOUNTER — Ambulatory Visit (HOSPITAL_BASED_OUTPATIENT_CLINIC_OR_DEPARTMENT_OTHER): Payer: 59 | Admitting: Nurse Practitioner

## 2015-05-21 ENCOUNTER — Other Ambulatory Visit: Payer: Self-pay | Admitting: Oncology

## 2015-05-21 ENCOUNTER — Other Ambulatory Visit (HOSPITAL_BASED_OUTPATIENT_CLINIC_OR_DEPARTMENT_OTHER): Payer: 59

## 2015-05-21 ENCOUNTER — Ambulatory Visit (HOSPITAL_BASED_OUTPATIENT_CLINIC_OR_DEPARTMENT_OTHER): Payer: 59

## 2015-05-21 VITALS — BP 88/53 | HR 64 | Temp 97.6°F | Resp 17 | Ht 71.0 in | Wt 199.3 lb

## 2015-05-21 DIAGNOSIS — B370 Candidal stomatitis: Secondary | ICD-10-CM

## 2015-05-21 DIAGNOSIS — R630 Anorexia: Secondary | ICD-10-CM

## 2015-05-21 DIAGNOSIS — Z5111 Encounter for antineoplastic chemotherapy: Secondary | ICD-10-CM

## 2015-05-21 DIAGNOSIS — R634 Abnormal weight loss: Secondary | ICD-10-CM

## 2015-05-21 DIAGNOSIS — C259 Malignant neoplasm of pancreas, unspecified: Secondary | ICD-10-CM

## 2015-05-21 DIAGNOSIS — Z95828 Presence of other vascular implants and grafts: Secondary | ICD-10-CM

## 2015-05-21 DIAGNOSIS — C250 Malignant neoplasm of head of pancreas: Secondary | ICD-10-CM

## 2015-05-21 DIAGNOSIS — C787 Secondary malignant neoplasm of liver and intrahepatic bile duct: Secondary | ICD-10-CM

## 2015-05-21 DIAGNOSIS — M549 Dorsalgia, unspecified: Secondary | ICD-10-CM

## 2015-05-21 DIAGNOSIS — R109 Unspecified abdominal pain: Secondary | ICD-10-CM

## 2015-05-21 LAB — CBC WITH DIFFERENTIAL/PLATELET
BASO%: 0.5 % (ref 0.0–2.0)
Basophils Absolute: 0.1 10*3/uL (ref 0.0–0.1)
EOS ABS: 0.1 10*3/uL (ref 0.0–0.5)
EOS%: 0.6 % (ref 0.0–7.0)
HCT: 30.7 % — ABNORMAL LOW (ref 38.4–49.9)
HEMOGLOBIN: 10.3 g/dL — AB (ref 13.0–17.1)
LYMPH#: 2.4 10*3/uL (ref 0.9–3.3)
LYMPH%: 16 % (ref 14.0–49.0)
MCH: 33 pg (ref 27.2–33.4)
MCHC: 33.5 g/dL (ref 32.0–36.0)
MCV: 98.6 fL — AB (ref 79.3–98.0)
MONO#: 2 10*3/uL — AB (ref 0.1–0.9)
MONO%: 13.1 % (ref 0.0–14.0)
NEUT%: 69.8 % (ref 39.0–75.0)
NEUTROS ABS: 10.6 10*3/uL — AB (ref 1.5–6.5)
PLATELETS: 483 10*3/uL — AB (ref 140–400)
RBC: 3.12 10*6/uL — ABNORMAL LOW (ref 4.20–5.82)
RDW: 16.4 % — ABNORMAL HIGH (ref 11.0–14.6)
WBC: 15.2 10*3/uL — ABNORMAL HIGH (ref 4.0–10.3)

## 2015-05-21 LAB — COMPREHENSIVE METABOLIC PANEL
ALBUMIN: 1.8 g/dL — AB (ref 3.5–5.0)
ALK PHOS: 558 U/L — AB (ref 40–150)
ALT: 140 U/L — ABNORMAL HIGH (ref 0–55)
AST: 137 U/L — ABNORMAL HIGH (ref 5–34)
Anion Gap: 9 mEq/L (ref 3–11)
BILIRUBIN TOTAL: 5.99 mg/dL — AB (ref 0.20–1.20)
BUN: 25.3 mg/dL (ref 7.0–26.0)
CO2: 21 mEq/L — ABNORMAL LOW (ref 22–29)
Calcium: 8.3 mg/dL — ABNORMAL LOW (ref 8.4–10.4)
Chloride: 107 mEq/L (ref 98–109)
Creatinine: 0.8 mg/dL (ref 0.7–1.3)
EGFR: 89 mL/min/{1.73_m2} — AB (ref >90)
GLUCOSE: 114 mg/dL (ref 70–140)
Potassium: 3.8 mEq/L (ref 3.5–5.1)
SODIUM: 136 meq/L (ref 136–145)
TOTAL PROTEIN: 5.8 g/dL — AB (ref 6.4–8.3)

## 2015-05-21 LAB — TECHNOLOGIST REVIEW

## 2015-05-21 MED ORDER — SODIUM CHLORIDE 0.9 % IV SOLN
Freq: Once | INTRAVENOUS | Status: AC
  Administered 2015-05-21: 15:00:00 via INTRAVENOUS

## 2015-05-21 MED ORDER — HEPARIN SOD (PORK) LOCK FLUSH 100 UNIT/ML IV SOLN
500.0000 [IU] | Freq: Once | INTRAVENOUS | Status: AC | PRN
  Administered 2015-05-21: 500 [IU]
  Filled 2015-05-21: qty 5

## 2015-05-21 MED ORDER — SODIUM CHLORIDE 0.9 % IJ SOLN
10.0000 mL | INTRAMUSCULAR | Status: DC | PRN
  Administered 2015-05-21: 10 mL
  Filled 2015-05-21: qty 10

## 2015-05-21 MED ORDER — PALONOSETRON HCL INJECTION 0.25 MG/5ML
0.2500 mg | Freq: Once | INTRAVENOUS | Status: AC
  Administered 2015-05-21: 0.25 mg via INTRAVENOUS

## 2015-05-21 MED ORDER — PACLITAXEL PROTEIN-BOUND CHEMO INJECTION 100 MG
50.0000 mg/m2 | Freq: Once | INTRAVENOUS | Status: AC
Start: 2015-05-21 — End: 2015-05-21
  Administered 2015-05-21: 100 mg via INTRAVENOUS
  Filled 2015-05-21: qty 20

## 2015-05-21 MED ORDER — PALONOSETRON HCL INJECTION 0.25 MG/5ML
INTRAVENOUS | Status: AC
  Filled 2015-05-21: qty 5

## 2015-05-21 MED ORDER — SODIUM CHLORIDE 0.9 % IV SOLN
10.0000 mg | Freq: Once | INTRAVENOUS | Status: AC
  Administered 2015-05-21: 10 mg via INTRAVENOUS
  Filled 2015-05-21: qty 1

## 2015-05-21 MED ORDER — SODIUM CHLORIDE 0.9% FLUSH
10.0000 mL | INTRAVENOUS | Status: DC | PRN
  Administered 2015-05-21: 10 mL via INTRAVENOUS
  Filled 2015-05-21: qty 10

## 2015-05-21 MED ORDER — SODIUM CHLORIDE 0.9 % IV SOLN
400.0000 mg/m2 | Freq: Once | INTRAVENOUS | Status: AC
  Administered 2015-05-21: 836 mg via INTRAVENOUS
  Filled 2015-05-21: qty 21.99

## 2015-05-21 NOTE — Telephone Encounter (Signed)
Talked with and scheduled this patient’s appointment(s) while patient was here in our office.       [facility]. °

## 2015-05-21 NOTE — Progress Notes (Addendum)
[facility] OFFICE PROGRESS NOTE   Diagnosis:  Pancreas cancer  INTERVAL HISTORY:   Jeremy Moody returns as scheduled. He completed a first treatment with gemcitabine/Abraxane on 05/07/2015. He had nausea, anorexia and fatigue beginning day 2. He had some night sweats and chills as well. No significant fever. No rash. No diarrhea. He continues pancreatic enzyme replacement. He notes some lightheadedness and dizziness. He attributes this to the low blood pressure.  Objective:  Vital signs in last 24 hours:  Blood pressure 88/53, pulse 64, temperature 97.6 F (36.4 C), temperature source Oral, resp. rate 17, height 5\' 11"  (1.803 m), weight 199 lb 4.8 oz (90.402 kg), SpO2 98 %.    HEENT: Scleral icterus. No thrush or ulcers. Resp: Lungs clear bilaterally. Cardio: Regular rate and rhythm. GI: Abdomen is distended. He appears to have ascites. Vascular: Bilateral leg edema. Skin: Jaundice.  Port-A-Cath without erythema.  Lab Results:  Lab Results  Component Value Date   WBC 15.2* 05/21/2015   HGB 10.3* 05/21/2015   HCT 30.7* 05/21/2015   MCV 98.6* 05/21/2015   PLT 483* 05/21/2015   NEUTROABS 10.6* 05/21/2015    Imaging:  No results found.  Medications: I have reviewed the patient's current medications.  Assessment/Plan: 1. Pancreas cancer, stage IV  CT abdomen/pelvis 04/29/2015 revealed a pancreas uncinate mass, liver metastases, lung nodules, extensive retroperitoneal/mesenteric, pelvic, and low mediastinal lymphadenopathy  Ultrasound-guided biopsy of a right liver lesion 05/02/2015 confirmed metastatic adenocarcinoma  Cycle 1 gemcitabine/Abraxane 05/07/2015  Cycle 2 gemcitabine/Abraxane 05/21/2015 (50% dose reduction)  2. Anorexia/weight loss  3. Abdominal/back pain secondary to the pancreas mass and liver metastases  4. History of atrial fibrillation and flutter  5. [place] likely secondary to pancreatic insufficiency, improved  with pancreatic enzyme replacement  6. Oral candidiasis 05/03/2015-treated with Diflucan  7. Hyperbilirubinemia.   CT abdomen/pelvis 05/14/2015 with no biliary duct dilatation. Mild lateral segment left liver lobe intrahepatic duct dilatation similar and likely secondary to a central left liver lobe metastasis. Decreased size of pancreatic uncinate process primary. Mild improvement in hepatic metastasis. Similar abdominal nodal metastasis. Porta hepatis node larger. Likely smimlar pulmonary metastasis. Increase in small volume of abdominal ascites.  Labs improved 05/21/2015  8.     Hypotension. Metoprolol adjusted to 12.5 mg twice daily   Disposition: Jeremy Moody has completed 1 cycle of gemcitabine/Abraxane. He developed progressive hyperbilirubinemia. There was no evidence of biliary obstruction on the recent CT scan. He understands the liver dysfunction is likely due to cancer in the liver but could also be a toxicity from chemotherapy. We discussed holding the chemotherapy and continuing to monitor LFTs versus proceeding with treatment as planned at a reduced dose. He understands there is a risk of increased toxicity from the chemotherapy in the setting of liver dysfunction. He is willing to accept this risk. He would like to proceed with chemotherapy as planned. Both the gemcitabine and Abraxane will be dose reduced by 50%.  He will return for labs and a follow-up visit in one week. We will adjust chemotherapy to be given on a 2 week schedule rather than day one/day 8.  He was noted to be hypotensive in the office today. He takes metoprolol 25 mg twice daily for rate control. Heart rate today 64. He will decrease the metoprolol to 12.5 mg twice daily.  He will contact the office in the interim with any problems.  Patient seen with Dr. Benay Spice. 25 minutes were spent face-to-face at today's visit with the majority of that  time involved in counseling/coordination of care.    Jeremy Moody ANP/GNP-BC   05/21/2015  1:57 PM  This was a shared visit with Jeremy Moody. Jeremy Moody was interviewed and examined. I reviewed the CT images from 05/14/2015 with an interventional radiologist. There is no area in the liver or extrahepatic biliary system amenable to stenting. The hyperbilirubinemia is likely secondary to tumor in the liver.  We reviewed the risk/benefit of continuing chemotherapy with Jeremy Moody. He understands the increased potential toxicity with chemotherapy in the setting of elevated liver enzymes and hyperbilirubinemia. The increase in the liver enzymes following the first cycle of chemotherapy may reflect toxicity from chemotherapy, but is more likely secondary to the liver tumor burden. He accepts the potential risk of increased toxicity and would like to proceed. We reviewed the case with the [place]. The plan is to proceed with gemcitabine/Abraxane on a 2 week schedule with dose reduction.  Jeremy Moody, M.D.

## 2015-05-21 NOTE — Patient Instructions (Signed)

## 2015-05-21 NOTE — Patient Instructions (Signed)
Cancer Center Discharge Instructions for Patients Receiving Chemotherapy  Today you received the following chemotherapy agents Abraxane and Gemzar  To help prevent nausea and vomiting after your treatment, we encourage you to take your nausea medication Compazine 10 mg every 6 hours as needed.   If you develop nausea and vomiting that is not controlled by your nausea medication, call the clinic.   BELOW ARE SYMPTOMS THAT SHOULD BE REPORTED IMMEDIATELY:  *FEVER GREATER THAN 100.5 F  *CHILLS WITH OR WITHOUT FEVER  NAUSEA AND VOMITING THAT IS NOT CONTROLLED WITH YOUR NAUSEA MEDICATION  *UNUSUAL SHORTNESS OF BREATH  *UNUSUAL BRUISING OR BLEEDING  TENDERNESS IN MOUTH AND THROAT WITH OR WITHOUT PRESENCE OF ULCERS  *URINARY PROBLEMS  *BOWEL PROBLEMS  UNUSUAL RASH Items with * indicate a potential emergency and should be followed up as soon as possible.  Feel free to call the clinic you have any questions or concerns. The clinic phone number is [phone].  Please show the CHEMO ALERT CARD at check-in to the Emergency Department and triage nurse.   

## 2015-05-22 ENCOUNTER — Other Ambulatory Visit (HOSPITAL_COMMUNITY)
Admission: RE | Admit: 2015-05-22 | Discharge: 2015-05-22 | Disposition: A | Discharge status: Home / Self Care | Payer: 59 | Source: Ambulatory Visit | Attending: Oncology | Admitting: Oncology

## 2015-05-22 DIAGNOSIS — Z029 Encounter for administrative examinations, unspecified: Secondary | ICD-10-CM | POA: Insufficient documentation

## 2015-05-22 NOTE — Progress Notes (Signed)
Nutrition follow up completed with patient during infusion. Patient reports he is able to eat about 5 times daily. He drinks one Boost daily. Weight increased and documented as 199.3 pounds. Noted abdominal ascites and bilateral leg edema. Patient states he had nausea, anorexia, and fatigue on day 2 after treatment. Questions about MVI during treatment.  Nutrition diagnosis:  Unintended weight loss improved.  Intervention:  Educated patient to continue frequent meals and snacks consisting of high calorie and high protein foods. I encouraged patient to continue protein shakes 1-2 times daily. Provided recipe books for ideas on healthy meals. Explained reason for avoiding excess vitamin and mineral supplements during treatment. Questions answered and teach back method used.  Monitoring, Evaluation, Goals:  Patient will tolerate increased calories and protein to minimize loss of lean body mass.  Next Visit: Tuesday, Feb 28, during infusion.

## 2015-05-27 ENCOUNTER — Telehealth: Payer: Self-pay | Admitting: *Deleted

## 2015-05-27 NOTE — Telephone Encounter (Signed)
Received message from pt stating "I really need something strong for my hemorrhoids; also the Vicodin is not strong enough and I need something stronger and also more lidocaine cream."  Per Dr. Benay Spice; notified pt that MD recommends OTC Anusol cream for hemorrhoids and will evaluate in office tomorrow 2/7 @ appt; instructions given to pt that he can increase the Vicodin to 2 tab every 4 until OV and again will reassess meds.  Informed pt that the lidocaine cream has refills and he can call his pharmacy for re-fill.  Pt verbalized understanding and confirmed appt 2/7 starting @ 11:45.

## 2015-05-28 ENCOUNTER — Telehealth: Payer: Self-pay | Admitting: *Deleted

## 2015-05-28 ENCOUNTER — Ambulatory Visit (HOSPITAL_BASED_OUTPATIENT_CLINIC_OR_DEPARTMENT_OTHER): Payer: 59 | Admitting: Nurse Practitioner

## 2015-05-28 ENCOUNTER — Other Ambulatory Visit (HOSPITAL_BASED_OUTPATIENT_CLINIC_OR_DEPARTMENT_OTHER): Payer: 59

## 2015-05-28 VITALS — BP 98/64 | HR 77 | Temp 98.3°F | Resp 20 | Wt 205.1 lb

## 2015-05-28 DIAGNOSIS — C7989 Secondary malignant neoplasm of other specified sites: Secondary | ICD-10-CM | POA: Diagnosis not present

## 2015-05-28 DIAGNOSIS — R634 Abnormal weight loss: Secondary | ICD-10-CM

## 2015-05-28 DIAGNOSIS — B370 Candidal stomatitis: Secondary | ICD-10-CM

## 2015-05-28 DIAGNOSIS — C259 Malignant neoplasm of pancreas, unspecified: Secondary | ICD-10-CM | POA: Diagnosis not present

## 2015-05-28 DIAGNOSIS — G893 Neoplasm related pain (acute) (chronic): Secondary | ICD-10-CM

## 2015-05-28 DIAGNOSIS — C787 Secondary malignant neoplasm of liver and intrahepatic bile duct: Secondary | ICD-10-CM

## 2015-05-28 DIAGNOSIS — R197 Diarrhea, unspecified: Secondary | ICD-10-CM

## 2015-05-28 DIAGNOSIS — D6481 Anemia due to antineoplastic chemotherapy: Secondary | ICD-10-CM

## 2015-05-28 DIAGNOSIS — R630 Anorexia: Secondary | ICD-10-CM

## 2015-05-28 DIAGNOSIS — R109 Unspecified abdominal pain: Secondary | ICD-10-CM

## 2015-05-28 LAB — COMPREHENSIVE METABOLIC PANEL
ALT: 147 U/L — ABNORMAL HIGH (ref 0–55)
ANION GAP: 8 meq/L (ref 3–11)
AST: 146 U/L — ABNORMAL HIGH (ref 5–34)
Albumin: 1.8 g/dL — ABNORMAL LOW (ref 3.5–5.0)
Alkaline Phosphatase: 311 U/L — ABNORMAL HIGH (ref 40–150)
BUN: 20.3 mg/dL (ref 7.0–26.0)
CHLORIDE: 110 meq/L — AB (ref 98–109)
CO2: 20 meq/L — AB (ref 22–29)
Calcium: 8.2 mg/dL — ABNORMAL LOW (ref 8.4–10.4)
Creatinine: 0.8 mg/dL (ref 0.7–1.3)
EGFR: 89 mL/min/{1.73_m2} — AB (ref >90)
GLUCOSE: 128 mg/dL (ref 70–140)
Potassium: 3.9 mEq/L (ref 3.5–5.1)
SODIUM: 138 meq/L (ref 136–145)
Total Bilirubin: 4.37 mg/dL (ref 0.20–1.20)
Total Protein: 5.6 g/dL — ABNORMAL LOW (ref 6.4–8.3)

## 2015-05-28 LAB — TECHNOLOGIST REVIEW

## 2015-05-28 LAB — CBC WITH DIFFERENTIAL/PLATELET
BASO%: 0.4 % (ref 0.0–2.0)
Basophils Absolute: 0.1 10*3/uL (ref 0.0–0.1)
EOS ABS: 0 10*3/uL (ref 0.0–0.5)
EOS%: 0.1 % (ref 0.0–7.0)
HEMATOCRIT: 26.7 % — AB (ref 38.4–49.9)
HEMOGLOBIN: 8.9 g/dL — AB (ref 13.0–17.1)
LYMPH#: 1.8 10*3/uL (ref 0.9–3.3)
LYMPH%: 14.6 % (ref 14.0–49.0)
MCH: 32.8 pg (ref 27.2–33.4)
MCHC: 33.3 g/dL (ref 32.0–36.0)
MCV: 98.5 fL — AB (ref 79.3–98.0)
MONO#: 1 10*3/uL — AB (ref 0.1–0.9)
MONO%: 8.4 % (ref 0.0–14.0)
NEUT%: 76.5 % — ABNORMAL HIGH (ref 39.0–75.0)
NEUTROS ABS: 9.2 10*3/uL — AB (ref 1.5–6.5)
PLATELETS: 229 10*3/uL (ref 140–400)
RBC: 2.71 10*6/uL — AB (ref 4.20–5.82)
RDW: 17.8 % — AB (ref 11.0–14.6)
WBC: 12 10*3/uL — AB (ref 4.0–10.3)

## 2015-05-28 MED ORDER — FLUCONAZOLE 100 MG PO TABS: 100.0000 mg | ORAL_TABLET | Freq: Every day | ORAL | Status: DC

## 2015-05-28 MED ORDER — OXYCODONE HCL 5 MG PO TABS: 5.0000 mg | ORAL_TABLET | ORAL | Status: DC | PRN

## 2015-05-28 MED FILL — FLUCONAZOLE 100 MG TABLET: 100 | 5 days supply | Qty: 5 | Fill #0

## 2015-05-28 MED FILL — oxyCODONE HCL 5 MG TABS: 5 | 6 days supply | Qty: 75 | Fill #0

## 2015-05-28 NOTE — Progress Notes (Addendum)
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 He completed cycle 2 gemcitabine/Abraxane 05/21/2015. Doses were reduced due to liver function abnormalities. Main complaint is poor energy. No nausea or vomiting. He thinks he has mouth sores. No diarrhea. He describes his appetite as "fair". He notes improvement in the lightheadedness/dizziness he was experiencing last week. He decreased the metoprolol dose as we had discussed. He is having hemorrhoid discomfort. He is taking Vicodin with minimal relief.  Objective:  Vital signs in last 24 hours:  Blood pressure 98/64, pulse 77, temperature 98.3 F (36.8 C), temperature source Oral, resp. rate 20, SpO2 97 %.    HEENT: Marked thrush. No ulcerations. Resp: Lungs clear bilaterally. Cardio: Regular rate and rhythm. GI: Abdomen is distended. He appears to have ascites. Vascular: Pitting leg edema. Neuro: Alert and oriented.  Skin: He appears less jaundiced. Port-A-Cath without erythema.    Lab Results:  Lab Results  Component Value Date   WBC 12.0* 05/28/2015   HGB 8.9* 05/28/2015   HCT 26.7* 05/28/2015   MCV 98.5* 05/28/2015   PLT 229 05/28/2015   NEUTROABS 9.2* 05/28/2015    Imaging:  No results found.  Medications: I have reviewed the patient's current medications.  Assessment/Plan: 1. Pancreas cancer, stage IV  CT abdomen/pelvis 04/29/2015 revealed a pancreas uncinate mass, liver metastases, lung nodules, extensive retroperitoneal/mesenteric, pelvic, and low mediastinal lymphadenopathy  Ultrasound-guided biopsy of a right liver lesion 05/02/2015 confirmed metastatic adenocarcinoma  Cycle 1 gemcitabine/Abraxane 05/07/2015  Cycle 2 gemcitabine/Abraxane 05/21/2015 (50% dose reduction)  2. Anorexia/weight loss  3. Abdominal/back pain secondary to the pancreas mass and liver metastases  4. History of atrial fibrillation  and flutter  5. [place] likely secondary to pancreatic insufficiency, improved with pancreatic enzyme replacement  6. Oral candidiasis 05/03/2015-treated with Diflucan; recurrent oral candidiasis 05/28/2015. Treated with Diflucan.  7. Hyperbilirubinemia.   CT abdomen/pelvis 05/14/2015 with no biliary duct dilatation. Mild lateral segment left liver lobe intrahepatic duct dilatation similar and likely secondary to a central left liver lobe metastasis. Decreased size of pancreatic uncinate process primary. Mild improvement in hepatic metastasis. Similar abdominal nodal metastasis. Porta hepatis node larger. Likely smimlar pulmonary metastasis. Increase in small volume of abdominal ascites.  Labs improved 05/21/2015; further improved 05/28/2015  8. Hypotension. Metoprolol adjusted to 12.5 mg twice daily 05/21/2015. Blood pressure improved 05/28/2015.   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 Doses were reduced with treatment 05/21/2015 due to liver function abnormalities. Overall he seems to have tolerated the most recent chemotherapy well. The bilirubin and alkaline phosphatase are better; transaminases stable. Other than progressive anemia, no significant myelotoxicity.  He has a fairly significant yeast infection in his mouth. He will complete a 5 day course of Diflucan. He understands the potential for an interaction with Diflucan and Tambocor.  For the hemorrhoids he will try sitz baths and over-the-counter Anusol cream.  He will return for a follow-up visit and cycle 3 gemcitabine/Abraxane on 06/05/2015. He will contact the office in the interim with any problems.  Patient seen with Dr. Benay Spice. 25 minutes were spent face-to-face at today's visit with the majority of that time involved in counseling/coordination of care.    Ned Card ANP/GNP-BC   05/28/2015  12:08 PM   this was a shared visit with Ned Card.  Dr. Redmond Pulling was  interviewed and examined. The  Hyperbilirubinemia has improved. His performance status appears improved. He will return for  an office visit and the next cycle of gemcitabine/Abraxane in one week.   Julieanne Manson, M.D.

## 2015-05-28 NOTE — Telephone Encounter (Signed)
I have adjusted 2/28 appt

## 2015-05-31 ENCOUNTER — Encounter: Payer: Self-pay | Admitting: Oncology

## 2015-05-31 NOTE — Progress Notes (Signed)
I faxed  [phone] notes and form to [facility]

## 2015-06-04 ENCOUNTER — Ambulatory Visit: Payer: 59

## 2015-06-04 MED FILL — LIDOCAINE-PRILOCAINE CREAM: 2.5-2.5 | 30 days supply | Qty: 30 | Fill #1

## 2015-06-05 ENCOUNTER — Ambulatory Visit (HOSPITAL_BASED_OUTPATIENT_CLINIC_OR_DEPARTMENT_OTHER): Payer: 59 | Admitting: Oncology

## 2015-06-05 ENCOUNTER — Other Ambulatory Visit: Payer: Self-pay | Admitting: *Deleted

## 2015-06-05 ENCOUNTER — Ambulatory Visit (HOSPITAL_BASED_OUTPATIENT_CLINIC_OR_DEPARTMENT_OTHER): Payer: 59

## 2015-06-05 ENCOUNTER — Encounter: Payer: Self-pay | Admitting: Oncology

## 2015-06-05 ENCOUNTER — Telehealth: Payer: Self-pay | Admitting: Oncology

## 2015-06-05 ENCOUNTER — Other Ambulatory Visit (HOSPITAL_BASED_OUTPATIENT_CLINIC_OR_DEPARTMENT_OTHER): Payer: 59

## 2015-06-05 VITALS — BP 97/58 | HR 65 | Resp 18 | Ht 71.0 in | Wt 207.2 lb

## 2015-06-05 DIAGNOSIS — I959 Hypotension, unspecified: Secondary | ICD-10-CM

## 2015-06-05 DIAGNOSIS — C250 Malignant neoplasm of head of pancreas: Secondary | ICD-10-CM | POA: Diagnosis not present

## 2015-06-05 DIAGNOSIS — C259 Malignant neoplasm of pancreas, unspecified: Secondary | ICD-10-CM

## 2015-06-05 DIAGNOSIS — Z5111 Encounter for antineoplastic chemotherapy: Secondary | ICD-10-CM | POA: Diagnosis not present

## 2015-06-05 DIAGNOSIS — C787 Secondary malignant neoplasm of liver and intrahepatic bile duct: Secondary | ICD-10-CM | POA: Diagnosis not present

## 2015-06-05 DIAGNOSIS — R911 Solitary pulmonary nodule: Secondary | ICD-10-CM | POA: Diagnosis not present

## 2015-06-05 DIAGNOSIS — G893 Neoplasm related pain (acute) (chronic): Secondary | ICD-10-CM

## 2015-06-05 DIAGNOSIS — R609 Edema, unspecified: Secondary | ICD-10-CM

## 2015-06-05 DIAGNOSIS — R748 Abnormal levels of other serum enzymes: Secondary | ICD-10-CM

## 2015-06-05 DIAGNOSIS — K8689 Other specified diseases of pancreas: Secondary | ICD-10-CM

## 2015-06-05 DIAGNOSIS — E8809 Other disorders of plasma-protein metabolism, not elsewhere classified: Secondary | ICD-10-CM

## 2015-06-05 LAB — COMPREHENSIVE METABOLIC PANEL
ALT: 56 U/L — AB (ref 0–55)
AST: 62 U/L — AB (ref 5–34)
Albumin: 2.1 g/dL — ABNORMAL LOW (ref 3.5–5.0)
Alkaline Phosphatase: 339 U/L — ABNORMAL HIGH (ref 40–150)
Anion Gap: 7 mEq/L (ref 3–11)
BILIRUBIN TOTAL: 2.65 mg/dL — AB (ref 0.20–1.20)
BUN: 15.1 mg/dL (ref 7.0–26.0)
CHLORIDE: 111 meq/L — AB (ref 98–109)
CO2: 22 meq/L (ref 22–29)
CREATININE: 0.8 mg/dL (ref 0.7–1.3)
Calcium: 8.2 mg/dL — ABNORMAL LOW (ref 8.4–10.4)
EGFR: >90 mL/min/{1.73_m2} (ref >90)
GLUCOSE: 87 mg/dL (ref 70–140)
Potassium: 3.3 mEq/L — ABNORMAL LOW (ref 3.5–5.1)
SODIUM: 140 meq/L (ref 136–145)
TOTAL PROTEIN: 5.6 g/dL — AB (ref 6.4–8.3)

## 2015-06-05 LAB — CBC WITH DIFFERENTIAL/PLATELET
BASO%: 0.6 % (ref 0.0–2.0)
Basophils Absolute: 0.1 10*3/uL (ref 0.0–0.1)
EOS%: 0.5 % (ref 0.0–7.0)
Eosinophils Absolute: 0.1 10*3/uL (ref 0.0–0.5)
HCT: 29.3 % — ABNORMAL LOW (ref 38.4–49.9)
HGB: 9.6 g/dL — ABNORMAL LOW (ref 13.0–17.1)
LYMPH%: 21.7 % (ref 14.0–49.0)
MCH: 33.7 pg — ABNORMAL HIGH (ref 27.2–33.4)
MCHC: 32.8 g/dL (ref 32.0–36.0)
MCV: 103 fL — ABNORMAL HIGH (ref 79.3–98.0)
MONO#: 2.1 10*3/uL — AB (ref 0.1–0.9)
MONO%: 15.5 % — AB (ref 0.0–14.0)
NEUT%: 61.7 % (ref 39.0–75.0)
NEUTROS ABS: 8.3 10*3/uL — AB (ref 1.5–6.5)
PLATELETS: 263 10*3/uL (ref 140–400)
RBC: 2.84 10*6/uL — AB (ref 4.20–5.82)
RDW: 20.8 % — ABNORMAL HIGH (ref 11.0–14.6)
WBC: 13.4 10*3/uL — AB (ref 4.0–10.3)
lymph#: 2.9 10*3/uL (ref 0.9–3.3)

## 2015-06-05 LAB — TECHNOLOGIST REVIEW

## 2015-06-05 MED ORDER — HEPARIN SOD (PORK) LOCK FLUSH 100 UNIT/ML IV SOLN
500.0000 [IU] | Freq: Once | INTRAVENOUS | Status: AC | PRN
  Administered 2015-06-05: 500 [IU]
  Filled 2015-06-05: qty 5

## 2015-06-05 MED ORDER — PALONOSETRON HCL INJECTION 0.25 MG/5ML
0.2500 mg | Freq: Once | INTRAVENOUS | Status: AC
  Administered 2015-06-05: 0.25 mg via INTRAVENOUS

## 2015-06-05 MED ORDER — PACLITAXEL PROTEIN-BOUND CHEMO INJECTION 100 MG
50.0000 mg/m2 | Freq: Once | INTRAVENOUS | Status: AC
  Administered 2015-06-05: 100 mg via INTRAVENOUS
  Filled 2015-06-05: qty 20

## 2015-06-05 MED ORDER — SODIUM CHLORIDE 0.9 % IV SOLN
Freq: Once | INTRAVENOUS | Status: AC
  Administered 2015-06-05: 13:00:00 via INTRAVENOUS

## 2015-06-05 MED ORDER — PALONOSETRON HCL INJECTION 0.25 MG/5ML
INTRAVENOUS | Status: AC
  Filled 2015-06-05: qty 5

## 2015-06-05 MED ORDER — SODIUM CHLORIDE 0.9 % IV SOLN
10.0000 mg | Freq: Once | INTRAVENOUS | Status: AC
  Administered 2015-06-05: 10 mg via INTRAVENOUS
  Filled 2015-06-05: qty 1

## 2015-06-05 MED ORDER — SODIUM CHLORIDE 0.9 % IJ SOLN
10.0000 mL | INTRAMUSCULAR | Status: DC | PRN
Start: 2015-06-05 — End: 2015-06-05
  Administered 2015-06-05: 10 mL
  Filled 2015-06-05: qty 10

## 2015-06-05 MED ORDER — SODIUM CHLORIDE 0.9 % IV SOLN
400.0000 mg/m2 | Freq: Once | INTRAVENOUS | Status: AC
  Administered 2015-06-05: 836 mg via INTRAVENOUS
  Filled 2015-06-05: qty 21.99

## 2015-06-05 MED ORDER — HYDROCODONE-ACETAMINOPHEN 5-325 MG PO TABS: 1.0000 | ORAL_TABLET | ORAL | Status: DC | PRN

## 2015-06-05 MED FILL — HYDROCODON-APAP 5-325: 5-325 | 13 days supply | Qty: 75 | Fill #0

## 2015-06-05 NOTE — Progress Notes (Signed)
Recd [employer]

## 2015-06-05 NOTE — Progress Notes (Signed)
  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 He completed a second treatment with gemcitabine/Abraxane 05/21/2015. He takes hydrocodone every 6 hours during the day for relief of abdominal pain. He has persistent leg swelling. His appetite is moderate. He is able to go about activities in the home and has been able to get out of the house over the past week. No new complaint.  Objective:  Vital signs in last 24 hours:  Blood pressure 97/58, pulse 65, resp. rate 18, height 5\' 11"  (1.803 m), weight 207 lb 3.2 oz (93.985 kg), SpO2 100 %.    HEENT: No thrush Resp: Lungs clear bilaterally Cardio: Regular rate and rhythm GI: Mildly distended, nontender, liver edge is palpable a few fingers below the right costal margin Vascular: 2+ pitting edema in the legs bilaterally  Portacath/PICC-without erythema  Lab Results:  Lab Results  Component Value Date   WBC 13.4* 06/05/2015   HGB 9.6* 06/05/2015   HCT 29.3* 06/05/2015   MCV 103.0* 06/05/2015   PLT 263 06/05/2015   NEUTROABS 8.3* 06/05/2015   Alkaline phosphatase 339, AST 62, ALT 56, bilirubin 2.65  Medications: I have reviewed the patient's current medications.  Assessment/Plan: 1. Pancreas cancer, stage IV  CT abdomen/pelvis 04/29/2015 revealed a pancreas uncinate mass, liver metastases, lung nodules, extensive retroperitoneal/mesenteric, pelvic, and low mediastinal lymphadenopathy  Ultrasound-guided biopsy of a right liver lesion 05/02/2015 confirmed metastatic adenocarcinoma  Cycle 1 gemcitabine/Abraxane 05/07/2015  Cycle 2 gemcitabine/Abraxane 05/21/2015 (50% dose reduction)  Cycle 3 gemcitabine/Abraxane 06/05/2015  2. Anorexia/weight loss  3. Abdominal/back pain secondary to the pancreas mass and liver metastases  4. History of atrial fibrillation and flutter  5. [place] likely secondary to pancreatic  insufficiency, improved with pancreatic enzyme replacement  6. Oral candidiasis 05/03/2015-treated with Diflucan; recurrent oral candidiasis 05/28/2015. Treated with Diflucan.  7. Hyperbilirubinemia.   CT abdomen/pelvis 05/14/2015 with no biliary duct dilatation. Mild lateral segment left liver lobe intrahepatic duct dilatation similar and likely secondary to a central left liver lobe metastasis. Decreased size of pancreatic uncinate process primary. Mild improvement in hepatic metastasis. Similar abdominal nodal metastasis. Porta hepatis node larger. Likely smimlar pulmonary metastasis. Increase in small volume of abdominal ascites.  Labs improved 05/21/2015; further improved 05/28/2015 and 06/05/2015  8. Hypotension. Metoprolol adjusted to 12.5 mg twice daily 05/21/2015.   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 He has tolerated the chemotherapy well and appears to have an improved performance status. We will check the CA 19-9 today. He will complete cycle 3 gemcitabine/Abraxane today. The elevated liver enzymes have improved. I encouraged him to elevate his legs and try support stockings for the leg edema. The edema is secondary to hypoalbuminemia and liver metastases.  Dr. Redmond Pulling will return for an office visit and chemotherapy in 2 weeks.  Betsy Coder, MD  06/05/2015  12:14 PM

## 2015-06-05 NOTE — Progress Notes (Signed)
placed for dr. Benay Spice to sign

## 2015-06-05 NOTE — Patient Instructions (Signed)
[facility] Discharge Instructions for Patients Receiving Chemotherapy  Today you received the following chemotherapy agents:  Gemzar and Abraxane  To help prevent nausea and vomiting after your treatment, we encourage you to take your nausea medication as ordered per MD.    If you develop nausea and vomiting that is not controlled by your nausea medication, call the clinic.   BELOW ARE SYMPTOMS THAT SHOULD BE REPORTED IMMEDIATELY:  *FEVER GREATER THAN 100.5 F  *CHILLS WITH OR WITHOUT FEVER  NAUSEA AND VOMITING THAT IS NOT CONTROLLED WITH YOUR NAUSEA MEDICATION  *UNUSUAL SHORTNESS OF BREATH  *UNUSUAL BRUISING OR BLEEDING  TENDERNESS IN MOUTH AND THROAT WITH OR WITHOUT PRESENCE OF ULCERS  *URINARY PROBLEMS  *BOWEL PROBLEMS  UNUSUAL RASH Items with * indicate a potential emergency and should be followed up as soon as possible.  Feel free to call the clinic you have any questions or concerns. The clinic phone number is [phone].  Please show the CHEMO ALERT CARD at check-in to the Emergency Department and triage nurse.   

## 2015-06-05 NOTE — Telephone Encounter (Signed)
appt made per 2/15 pof. Pt will get printout in infusion

## 2015-06-06 ENCOUNTER — Encounter: Payer: Self-pay | Admitting: Oncology

## 2015-06-06 ENCOUNTER — Encounter (HOSPITAL_COMMUNITY): Payer: Self-pay

## 2015-06-06 LAB — CANCER ANTIGEN 19-9

## 2015-06-06 NOTE — Progress Notes (Signed)
Recd form from [employer]

## 2015-06-06 NOTE — Progress Notes (Signed)
faxed [phone] [id number]. I mailed copy to patient for his records and sent to medical records. sharepoint noted.

## 2015-06-06 NOTE — Progress Notes (Signed)
Mailed form/notes to [employer] and copy to patient. Sent to medical records and sharepoint noted.

## 2015-06-06 NOTE — Progress Notes (Signed)
placed fml[employer] form for dr. Benay Spice to sign

## 2015-06-11 ENCOUNTER — Ambulatory Visit: Payer: 59

## 2015-06-12 ENCOUNTER — Other Ambulatory Visit: Payer: Self-pay | Admitting: *Deleted

## 2015-06-12 MED ORDER — FUROSEMIDE 20 MG PO TABS: 20.0000 mg | ORAL_TABLET | Freq: Two times a day (BID) | ORAL | Status: DC

## 2015-06-12 MED ORDER — POTASSIUM CHLORIDE CRYS ER 20 MEQ PO TBCR: 20.0000 meq | EXTENDED_RELEASE_TABLET | Freq: Every day | ORAL | Status: DC

## 2015-06-12 MED FILL — POTASSIUM CL ER 20 MEQ TAB: 20 | 7 days supply | Qty: 7 | Fill #0

## 2015-06-12 MED FILL — FUROSEMIDE 20 MG TABLET: 20 | 7 days supply | Qty: 14 | Fill #0

## 2015-06-12 MED FILL — CREON DR 36,000 UNITS CAP: 36000 | 30 days supply | Qty: 180 | Fill #1

## 2015-06-12 NOTE — Telephone Encounter (Signed)
Call from pt requesting trial of Lasix for BLE edema. He reports swelling is worse, legs "feel like they're going to pop." Reviewed with Dr. Benay Spice. Order received, sent to pharmacy.

## 2015-06-18 ENCOUNTER — Ambulatory Visit: Payer: 59

## 2015-06-18 ENCOUNTER — Ambulatory Visit: Payer: 59 | Admitting: Nutrition

## 2015-06-18 ENCOUNTER — Ambulatory Visit (HOSPITAL_BASED_OUTPATIENT_CLINIC_OR_DEPARTMENT_OTHER): Payer: 59 | Admitting: Oncology

## 2015-06-18 ENCOUNTER — Telehealth: Payer: Self-pay | Admitting: *Deleted

## 2015-06-18 ENCOUNTER — Other Ambulatory Visit (HOSPITAL_BASED_OUTPATIENT_CLINIC_OR_DEPARTMENT_OTHER): Payer: 59

## 2015-06-18 ENCOUNTER — Telehealth: Payer: Self-pay | Admitting: Oncology

## 2015-06-18 ENCOUNTER — Ambulatory Visit (HOSPITAL_BASED_OUTPATIENT_CLINIC_OR_DEPARTMENT_OTHER): Payer: 59

## 2015-06-18 VITALS — BP 91/50 | HR 64 | Temp 98.0°F | Resp 18 | Ht 71.0 in | Wt 208.9 lb

## 2015-06-18 DIAGNOSIS — I959 Hypotension, unspecified: Secondary | ICD-10-CM

## 2015-06-18 DIAGNOSIS — C259 Malignant neoplasm of pancreas, unspecified: Secondary | ICD-10-CM

## 2015-06-18 DIAGNOSIS — C257 Malignant neoplasm of other parts of pancreas: Secondary | ICD-10-CM | POA: Diagnosis not present

## 2015-06-18 DIAGNOSIS — C787 Secondary malignant neoplasm of liver and intrahepatic bile duct: Secondary | ICD-10-CM

## 2015-06-18 DIAGNOSIS — K8689 Other specified diseases of pancreas: Secondary | ICD-10-CM

## 2015-06-18 DIAGNOSIS — Z5111 Encounter for antineoplastic chemotherapy: Secondary | ICD-10-CM | POA: Diagnosis not present

## 2015-06-18 DIAGNOSIS — C250 Malignant neoplasm of head of pancreas: Secondary | ICD-10-CM

## 2015-06-18 LAB — CBC WITH DIFFERENTIAL/PLATELET
BASO%: 0.4 % (ref 0.0–2.0)
Basophils Absolute: 0 10*3/uL (ref 0.0–0.1)
EOS ABS: 0.1 10*3/uL (ref 0.0–0.5)
EOS%: 0.4 % (ref 0.0–7.0)
HCT: 25.8 % — ABNORMAL LOW (ref 38.4–49.9)
HGB: 8.5 g/dL — ABNORMAL LOW (ref 13.0–17.1)
LYMPH#: 2.2 10*3/uL (ref 0.9–3.3)
LYMPH%: 18.4 % (ref 14.0–49.0)
MCH: 34.8 pg — AB (ref 27.2–33.4)
MCHC: 33.1 g/dL (ref 32.0–36.0)
MCV: 105.3 fL — AB (ref 79.3–98.0)
MONO#: 1.3 10*3/uL — ABNORMAL HIGH (ref 0.1–0.9)
MONO%: 10.9 % (ref 0.0–14.0)
NEUT%: 69.9 % (ref 39.0–75.0)
NEUTROS ABS: 8.2 10*3/uL — AB (ref 1.5–6.5)
PLATELETS: 210 10*3/uL (ref 140–400)
RBC: 2.45 10*6/uL — AB (ref 4.20–5.82)
RDW: 20.5 % — AB (ref 11.0–14.6)
WBC: 11.7 10*3/uL — ABNORMAL HIGH (ref 4.0–10.3)

## 2015-06-18 LAB — COMPREHENSIVE METABOLIC PANEL
ALT: 23 U/L (ref 0–55)
ANION GAP: 8 meq/L (ref 3–11)
AST: 37 U/L — ABNORMAL HIGH (ref 5–34)
Albumin: 2.1 g/dL — ABNORMAL LOW (ref 3.5–5.0)
Alkaline Phosphatase: 214 U/L — ABNORMAL HIGH (ref 40–150)
BILIRUBIN TOTAL: 2.01 mg/dL — AB (ref 0.20–1.20)
BUN: 17.8 mg/dL (ref 7.0–26.0)
CHLORIDE: 110 meq/L — AB (ref 98–109)
CO2: 22 meq/L (ref 22–29)
Calcium: 8.1 mg/dL — ABNORMAL LOW (ref 8.4–10.4)
Creatinine: 1 mg/dL (ref 0.7–1.3)
EGFR: 76 mL/min/{1.73_m2} — AB (ref >90)
GLUCOSE: 98 mg/dL (ref 70–140)
POTASSIUM: 3.3 meq/L — AB (ref 3.5–5.1)
SODIUM: 140 meq/L (ref 136–145)
TOTAL PROTEIN: 5.6 g/dL — AB (ref 6.4–8.3)

## 2015-06-18 LAB — TECHNOLOGIST REVIEW

## 2015-06-18 MED ORDER — HYDROCODONE-ACETAMINOPHEN 5-325 MG PO TABS
1.0000 | ORAL_TABLET | Freq: Once | ORAL | Status: AC
  Administered 2015-06-18: 1 via ORAL

## 2015-06-18 MED ORDER — HYDROCODONE-ACETAMINOPHEN 5-325 MG PO TABS: 1.0000 | ORAL_TABLET | ORAL | Status: DC | PRN

## 2015-06-18 MED ORDER — SODIUM CHLORIDE 0.9 % IV SOLN
400.0000 mg/m2 | Freq: Once | INTRAVENOUS | Status: AC
  Administered 2015-06-18: 836 mg via INTRAVENOUS
  Filled 2015-06-18: qty 21.99

## 2015-06-18 MED ORDER — HEPARIN SOD (PORK) LOCK FLUSH 100 UNIT/ML IV SOLN
500.0000 [IU] | Freq: Once | INTRAVENOUS | Status: AC | PRN
  Administered 2015-06-18: 500 [IU]
  Filled 2015-06-18: qty 5

## 2015-06-18 MED ORDER — SODIUM CHLORIDE 0.9 % IJ SOLN
10.0000 mL | INTRAMUSCULAR | Status: DC | PRN
  Administered 2015-06-18: 10 mL
  Filled 2015-06-18: qty 10

## 2015-06-18 MED ORDER — SODIUM CHLORIDE 0.9 % IV SOLN
10.0000 mg | Freq: Once | INTRAVENOUS | Status: AC
  Administered 2015-06-18: 10 mg via INTRAVENOUS
  Filled 2015-06-18: qty 1

## 2015-06-18 MED ORDER — PACLITAXEL PROTEIN-BOUND CHEMO INJECTION 100 MG
50.0000 mg/m2 | Freq: Once | INTRAVENOUS | Status: AC
  Administered 2015-06-18: 100 mg via INTRAVENOUS
  Filled 2015-06-18: qty 20

## 2015-06-18 MED ORDER — SODIUM CHLORIDE 0.9 % IV SOLN
Freq: Once | INTRAVENOUS | Status: AC
  Administered 2015-06-18: 12:00:00 via INTRAVENOUS

## 2015-06-18 MED ORDER — PALONOSETRON HCL INJECTION 0.25 MG/5ML
INTRAVENOUS | Status: AC
  Filled 2015-06-18: qty 5

## 2015-06-18 MED ORDER — FUROSEMIDE 20 MG PO TABS: 20.0000 mg | ORAL_TABLET | Freq: Two times a day (BID) | ORAL | Status: DC

## 2015-06-18 MED ORDER — HYDROCODONE-ACETAMINOPHEN 5-325 MG PO TABS
ORAL_TABLET | ORAL | Status: AC
  Filled 2015-06-18: qty 1

## 2015-06-18 MED ORDER — SODIUM CHLORIDE 0.9% FLUSH
10.0000 mL | INTRAVENOUS | Status: DC | PRN
  Administered 2015-06-18: 10 mL via INTRAVENOUS
  Filled 2015-06-18: qty 10

## 2015-06-18 MED ORDER — POTASSIUM CHLORIDE CRYS ER 20 MEQ PO TBCR: 20.0000 meq | EXTENDED_RELEASE_TABLET | Freq: Two times a day (BID) | ORAL | Status: DC

## 2015-06-18 MED ORDER — POTASSIUM CHLORIDE CRYS ER 20 MEQ PO TBCR: 20.0000 meq | EXTENDED_RELEASE_TABLET | Freq: Every day | ORAL | Status: DC

## 2015-06-18 MED ORDER — PALONOSETRON HCL INJECTION 0.25 MG/5ML
0.2500 mg | Freq: Once | INTRAVENOUS | Status: AC
  Administered 2015-06-18: 0.25 mg via INTRAVENOUS

## 2015-06-18 MED FILL — POTASSIUM CL ER 20 MEQ TAB: 20 | 30 days supply | Qty: 60 | Fill #0

## 2015-06-18 MED FILL — FUROSEMIDE 20 MG TABLET: 20 | 30 days supply | Qty: 60 | Fill #0

## 2015-06-18 MED FILL — HYDROCODON-APAP 5-325: 5-325 | 13 days supply | Qty: 75 | Fill #0

## 2015-06-18 NOTE — Telephone Encounter (Signed)
[employer] AD[employer] forms completed, signed by Dr. Benay Spice and taken to [facility] to attach records and fax.

## 2015-06-18 NOTE — Patient Instructions (Signed)
[facility] Discharge Instructions for Patients Receiving Chemotherapy  Today you received the following chemotherapy agents:  Gemzar and Abraxane  To help prevent nausea and vomiting after your treatment, we encourage you to take your nausea medication as ordered per MD.    If you develop nausea and vomiting that is not controlled by your nausea medication, call the clinic.   BELOW ARE SYMPTOMS THAT SHOULD BE REPORTED IMMEDIATELY:  *FEVER GREATER THAN 100.5 F  *CHILLS WITH OR WITHOUT FEVER  NAUSEA AND VOMITING THAT IS NOT CONTROLLED WITH YOUR NAUSEA MEDICATION  *UNUSUAL SHORTNESS OF BREATH  *UNUSUAL BRUISING OR BLEEDING  TENDERNESS IN MOUTH AND THROAT WITH OR WITHOUT PRESENCE OF ULCERS  *URINARY PROBLEMS  *BOWEL PROBLEMS  UNUSUAL RASH Items with * indicate a potential emergency and should be followed up as soon as possible.  Feel free to call the clinic you have any questions or concerns. The clinic phone number is [phone].  Please show the CHEMO ALERT CARD at check-in to the Emergency Department and triage nurse.   

## 2015-06-18 NOTE — Telephone Encounter (Signed)
per pof to sch pt appt-sent MW email to sch trmt-pt aware °

## 2015-06-18 NOTE — Patient Instructions (Signed)

## 2015-06-18 NOTE — Progress Notes (Signed)
  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 He tolerated the chemotherapy well. He continues to have bilateral leg swelling. He began a trial of Lasix last week. This has helped partially. He has a good energy level. The pain has improved. He now takes hydrocodone only 2 or 3 times per day. He continues to have anorexia.  Objective:  Vital signs in last 24 hours:  Blood pressure 91/50, pulse 64, temperature 98 F (36.7 C), temperature source Oral, resp. rate 18, height '5\' 11"'$  (1.803 m), weight 208 lb 14.4 oz (94.756 kg), SpO2 100 %.    HEENT: No thrush or ulcers Resp: Lungs clear bilaterally Cardio: Regular rate and rhythm GI: The liver edge is palpable 1-2 fingers below the right costal margin, nontender, there is ascites Vascular: Pitting edema throughout the leg bilaterally  Portacath/PICC-without erythema  Lab Results:  Lab Results  Component Value Date   WBC 11.7* 06/18/2015   HGB 8.5* 06/18/2015   HCT 25.8* 06/18/2015   MCV 105.3* 06/18/2015   PLT 210 06/18/2015   NEUTROABS 8.2* 06/18/2015    Potassium 3.3, creatinine 1.0, alk phosphatase 214, albumin 2.1, AST 37, ALT 23, bilirubin 2.01   Medications: I have reviewed the patient's current medications.  Assessment/Plan: 1. Pancreas cancer, stage IV  CT abdomen/pelvis 04/29/2015 revealed a pancreas uncinate mass, liver metastases, lung nodules, extensive retroperitoneal/mesenteric, pelvic, and low mediastinal lymphadenopathy  Ultrasound-guided biopsy of a right liver lesion 05/02/2015 confirmed metastatic adenocarcinoma  Cycle 1 gemcitabine/Abraxane 05/07/2015  Cycle 2 gemcitabine/Abraxane 05/21/2015 (50% dose reduction)  Cycle 3 gemcitabine/Abraxane 06/05/2015  Cycle 4 gemcitabine/Abraxane 06/18/2015  2. Anorexia/weight loss  3. Abdominal/back pain secondary to the pancreas mass and  liver metastases-improved  4. History of atrial fibrillation and flutter  5. [place] likely secondary to pancreatic insufficiency, improved with pancreatic enzyme replacement  6. Oral candidiasis 05/03/2015-treated with Diflucan; recurrent oral candidiasis 05/28/2015. Treated with Diflucan.  7. Hyperbilirubinemia.   CT abdomen/pelvis 05/14/2015 with no biliary duct dilatation. Mild lateral segment left liver lobe intrahepatic duct dilatation similar and likely secondary to a central left liver lobe metastasis. Decreased size of pancreatic uncinate process primary. Mild improvement in hepatic metastasis. Similar abdominal nodal metastasis. Porta hepatis node larger. Likely smimlar pulmonary metastasis. Increase in small volume of abdominal ascites.  Labs improved 05/21/2015; further improved 05/28/2015, 06/05/2015, and 06/18/2015  8. Hypotension. Metoprolol adjusted to 12.5 mg twice daily 05/21/2015.     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 His pain and overall performance status have improved. The elevated liver enzymes are better. The plan is to proceed with cycle 4 gemcitabine/Abraxane today. We will follow-up on the CA 19-9 from today. He will return for an office visit and chemotherapy in 2 weeks. The plan is to schedule a restaging CT after 6 treatments with gemcitabine/Abraxane.  I encouraged him to elevate his legs and try support stockings for the edema. The edema secondary to hypoalbuminemia and liver metastases. He will continue Lasix for now.  Betsy Coder, MD  06/18/2015  2:08 PM

## 2015-06-18 NOTE — Progress Notes (Signed)
Nutrition follow-up completed with patient during infusion for cancer of the pancreas. Weight increased and documented as 208.9 pounds on February 28, likely secondary to persistent leg swelling. Patient is tiring of oral nutrition supplements. States he will drink boost occasionally. Enjoys resource breeze however feels like it is now giving him diarrhea. Patient reports taste alterations.  Nutrition diagnosis: Unintended weight loss cannot be evaluated.  Intervention:  Educated patient to continue to focus on high-calorie high-protein foods. Encouraged patient to consume resource breeze over a longer time, approximately over 30 minutes. Educated patient on strategies for improving taste alterations and provided fact sheet Questions were answered.  Teach back method used.  Monitoring, evaluation, goals: Patient will work to increase calories and protein to maintain lean body mass.  Next visit: To be scheduled as needed.  **Disclaimer: This note was dictated with voice recognition software. Similar sounding words can inadvertently be transcribed and this note may contain transcription errors which may not have been corrected upon publication of note.**

## 2015-06-19 ENCOUNTER — Telehealth: Payer: Self-pay | Admitting: *Deleted

## 2015-06-19 ENCOUNTER — Encounter: Payer: Self-pay | Admitting: Oncology

## 2015-06-19 ENCOUNTER — Other Ambulatory Visit: Payer: Self-pay | Admitting: *Deleted

## 2015-06-19 DIAGNOSIS — C259 Malignant neoplasm of pancreas, unspecified: Secondary | ICD-10-CM

## 2015-06-19 LAB — CANCER ANTIGEN 19-9: CA 19-9: 40495 U/mL — ABNORMAL HIGH (ref 0–35)

## 2015-06-19 NOTE — Progress Notes (Signed)
I sent copy of [employer] forms faxed to the patient.

## 2015-06-19 NOTE — Telephone Encounter (Signed)
Called pt with CA19-9 result. He voiced appreciation for call.

## 2015-06-19 NOTE — Progress Notes (Signed)
faxed to [employer]  [phone]

## 2015-06-19 NOTE — Telephone Encounter (Signed)
Message from Charlean Sanfilippo with [facility] requesting medical records. They received ADB forms without accompanying documentation. In-basket request to managed care requesting records to be sent. Please send medical records to accompany [place] form to fax # [phone] Attn: Charlean Sanfilippo. They need recent office visit, imaging and labs.  Phone [phone]

## 2015-06-19 NOTE — Telephone Encounter (Signed)
Per staff message and POF I have scheduled appts. Advised scheduler of appts. JMW  

## 2015-06-19 NOTE — Telephone Encounter (Signed)
-----   Message from Ladell Pier, MD sent at 06/19/2015  4:16 PM EST ----- Please call patient, ca19-9 is better

## 2015-06-20 ENCOUNTER — Encounter: Payer: Self-pay | Admitting: Oncology

## 2015-06-20 NOTE — Progress Notes (Signed)
Faxed notes/labs to [place] at [facility]

## 2015-06-24 ENCOUNTER — Ambulatory Visit (HOSPITAL_COMMUNITY)
Admission: RE | Admit: 2015-06-24 | Discharge: 2015-06-24 | Disposition: A | Discharge status: Home / Self Care | Payer: 59 | Source: Ambulatory Visit | Attending: Oncology | Admitting: Oncology

## 2015-06-24 ENCOUNTER — Ambulatory Visit (HOSPITAL_BASED_OUTPATIENT_CLINIC_OR_DEPARTMENT_OTHER): Payer: 59

## 2015-06-24 ENCOUNTER — Ambulatory Visit (HOSPITAL_BASED_OUTPATIENT_CLINIC_OR_DEPARTMENT_OTHER): Payer: 59 | Admitting: Nurse Practitioner

## 2015-06-24 ENCOUNTER — Telehealth: Payer: Self-pay | Admitting: Oncology

## 2015-06-24 ENCOUNTER — Other Ambulatory Visit (HOSPITAL_BASED_OUTPATIENT_CLINIC_OR_DEPARTMENT_OTHER): Payer: 59

## 2015-06-24 ENCOUNTER — Telehealth: Payer: Self-pay | Admitting: *Deleted

## 2015-06-24 VITALS — BP 101/51 | HR 92 | Temp 98.2°F | Resp 18

## 2015-06-24 VITALS — BP 117/48 | HR 100 | Temp 97.7°F | Resp 17 | Ht 71.0 in | Wt 216.5 lb

## 2015-06-24 DIAGNOSIS — D649 Anemia, unspecified: Secondary | ICD-10-CM | POA: Diagnosis not present

## 2015-06-24 DIAGNOSIS — C259 Malignant neoplasm of pancreas, unspecified: Secondary | ICD-10-CM

## 2015-06-24 DIAGNOSIS — R0600 Dyspnea, unspecified: Secondary | ICD-10-CM

## 2015-06-24 DIAGNOSIS — R600 Localized edema: Secondary | ICD-10-CM | POA: Diagnosis not present

## 2015-06-24 DIAGNOSIS — C257 Malignant neoplasm of other parts of pancreas: Secondary | ICD-10-CM | POA: Diagnosis not present

## 2015-06-24 DIAGNOSIS — C787 Secondary malignant neoplasm of liver and intrahepatic bile duct: Secondary | ICD-10-CM

## 2015-06-24 LAB — CBC WITH DIFFERENTIAL/PLATELET
BASO%: 0.1 % (ref 0.0–2.0)
BASOS ABS: 0 10*3/uL (ref 0.0–0.1)
EOS%: 0.1 % (ref 0.0–7.0)
Eosinophils Absolute: 0 10*3/uL (ref 0.0–0.5)
HEMATOCRIT: 22.6 % — AB (ref 38.4–49.9)
HGB: 7.4 g/dL — ABNORMAL LOW (ref 13.0–17.1)
LYMPH#: 1.2 10*3/uL (ref 0.9–3.3)
LYMPH%: 10.8 % — AB (ref 14.0–49.0)
MCH: 34.8 pg — AB (ref 27.2–33.4)
MCHC: 32.7 g/dL (ref 32.0–36.0)
MCV: 106.6 fL — ABNORMAL HIGH (ref 79.3–98.0)
MONO#: 0.5 10*3/uL (ref 0.1–0.9)
MONO%: 4.5 % (ref 0.0–14.0)
NEUT#: 9.8 10*3/uL — ABNORMAL HIGH (ref 1.5–6.5)
NEUT%: 84.5 % — AB (ref 39.0–75.0)
PLATELETS: 162 10*3/uL (ref 140–400)
RBC: 2.12 10*6/uL — ABNORMAL LOW (ref 4.20–5.82)
RDW: 22.3 % — ABNORMAL HIGH (ref 11.0–14.6)
WBC: 11.6 10*3/uL — ABNORMAL HIGH (ref 4.0–10.3)

## 2015-06-24 LAB — PREPARE RBC (CROSSMATCH)

## 2015-06-24 MED ORDER — SODIUM CHLORIDE 0.9% FLUSH
10.0000 mL | INTRAVENOUS | Status: AC | PRN
  Administered 2015-06-24: 10 mL
  Filled 2015-06-24: qty 10

## 2015-06-24 MED ORDER — HEPARIN SOD (PORK) LOCK FLUSH 100 UNIT/ML IV SOLN
500.0000 [IU] | Freq: Every day | INTRAVENOUS | Status: AC | PRN
  Administered 2015-06-24: 500 [IU]
  Filled 2015-06-24: qty 5

## 2015-06-24 MED ORDER — FUROSEMIDE 10 MG/ML IJ SOLN
20.0000 mg | Freq: Once | INTRAMUSCULAR | Status: AC
  Administered 2015-06-24: 20 mg via INTRAVENOUS

## 2015-06-24 MED ORDER — SODIUM CHLORIDE 0.9 % IV SOLN: 250.0000 mL | Freq: Once | INTRAVENOUS | Status: DC

## 2015-06-24 NOTE — Progress Notes (Addendum)
  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 He completed cycle 4 gemcitabine/Abraxane 06/18/2015. He denies nausea/vomiting. No mouth sores. No diarrhea. No rash. His main complaint is progressive dyspnea over the past 3-4 days. He denies fever and cough. No chest pain. He notes that his heart rate increases with exertion. He recently discontinued Lasix due to a low blood pressure. He notes progressive leg swelling since Lasix was discontinued. He does not feel that he is in heart failure.  Objective:  Vital signs in last 24 hours:  Blood pressure 117/48, pulse 100, temperature 97.7 F (36.5 C), temperature source Oral, resp. rate 17, height 5\' 11"  (1.803 m), weight 216 lb 8 oz (98.204 kg), SpO2 100 %.    HEENT: No thrush or ulcers. Resp: Lungs are clear bilaterally. Cardio: Regular rate and rhythm. No JVD. GI: Abdomen is distended. Ascites present. Vascular: Pitting edema throughout both legs. Port-A-Cath without erythema.    Lab Results:  Lab Results  Component Value Date   WBC 11.6* 06/24/2015   HGB 7.4* 06/24/2015   HCT 22.6* 06/24/2015   MCV 106.6* 06/24/2015   PLT 162 06/24/2015   NEUTROABS 9.8* 06/24/2015    Imaging:  No results found.  Medications: I have reviewed the patient's current medications.  Assessment/Plan: 1. Pancreas cancer, stage IV  CT abdomen/pelvis 04/29/2015 revealed a pancreas uncinate mass, liver metastases, lung nodules, extensive retroperitoneal/mesenteric, pelvic, and low mediastinal lymphadenopathy  Ultrasound-guided biopsy of a right liver lesion 05/02/2015 confirmed metastatic adenocarcinoma  Cycle 1 gemcitabine/Abraxane 05/07/2015  Cycle 2 gemcitabine/Abraxane 05/21/2015 (50% dose reduction)  Cycle 3 gemcitabine/Abraxane 06/05/2015  Cycle 4 gemcitabine/Abraxane 06/18/2015  2. Anorexia/weight loss  3. Abdominal/back  pain secondary to the pancreas mass and liver metastases-improved  4. History of atrial fibrillation and flutter  5. [place] likely secondary to pancreatic insufficiency, improved with pancreatic enzyme replacement  6. Oral candidiasis 05/03/2015-treated with Diflucan; recurrent oral candidiasis 05/28/2015. Treated with Diflucan.  7. Hyperbilirubinemia.   CT abdomen/pelvis 05/14/2015 with no biliary duct dilatation. Mild lateral segment left liver lobe intrahepatic duct dilatation similar and likely secondary to a central left liver lobe metastasis. Decreased size of pancreatic uncinate process primary. Mild improvement in hepatic metastasis. Similar abdominal nodal metastasis. Porta hepatis node larger. Likely smimlar pulmonary metastasis. Increase in small volume of abdominal ascites.  Labs improved 05/21/2015; further improved 05/28/2015, 06/05/2015 and 06/18/2015  8. Hypotension. Metoprolol adjusted to 12.5 mg twice daily 05/21/2015.    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 He presents today with progressive dyspnea over the past 3-4 days. He has increased edema since discontinuing Lasix. He has progressive anemia.  He will receive 1 unit of blood today to be followed by Lasix 20 mg intravenously. He will return tomorrow for a second unit of blood.  His next scheduled visit is 07/02/2015. He will contact the office in the interim with any problems.  Patient seen with Dr. Benay Spice.    Ned Card ANP/GNP-BC   06/24/2015  4:17 PM   This was a shared visit with Ned Card. Dr. Redmond Moody will receive 2 units of packed blood blood cells for symptomatic anemia. The leg edema is secondary to hypoalbuminemia and liver metastases. He will resume a trial of Lasix.  Julieanne Manson, M.D.

## 2015-06-24 NOTE — Telephone Encounter (Signed)
s.w. pt and advised on todays appt.....pt ok and aware °

## 2015-06-24 NOTE — Patient Instructions (Signed)

## 2015-06-24 NOTE — Telephone Encounter (Signed)
Call from pt reporting severe fatigue, waking up dyspneic, tachycardic and having difficulty ambulating to restroom. Reviewed with Dr. Benay Spice. Order received for labs/ office visit work-in. Called pt with appt for 1:45.

## 2015-06-25 ENCOUNTER — Ambulatory Visit (HOSPITAL_BASED_OUTPATIENT_CLINIC_OR_DEPARTMENT_OTHER): Payer: 59

## 2015-06-25 VITALS — BP 87/50 | HR 71 | Temp 97.7°F | Resp 16

## 2015-06-25 DIAGNOSIS — D649 Anemia, unspecified: Secondary | ICD-10-CM

## 2015-06-25 DIAGNOSIS — R5382 Chronic fatigue, unspecified: Secondary | ICD-10-CM

## 2015-06-25 DIAGNOSIS — R109 Unspecified abdominal pain: Secondary | ICD-10-CM

## 2015-06-25 DIAGNOSIS — C250 Malignant neoplasm of head of pancreas: Secondary | ICD-10-CM

## 2015-06-25 DIAGNOSIS — R634 Abnormal weight loss: Secondary | ICD-10-CM

## 2015-06-25 LAB — ABO/RH: ABO/RH(D): O POS

## 2015-06-25 MED ORDER — SODIUM CHLORIDE 0.9 % IV SOLN
250.0000 mL | Freq: Once | INTRAVENOUS | Status: AC
  Administered 2015-06-25: 250 mL via INTRAVENOUS

## 2015-06-25 MED ORDER — FUROSEMIDE 10 MG/ML IJ SOLN
20.0000 mg | Freq: Once | INTRAMUSCULAR | Status: AC
  Administered 2015-06-25: 20 mg via INTRAVENOUS

## 2015-06-25 MED ORDER — FUROSEMIDE 10 MG/ML IJ SOLN: 20.0000 mg | Freq: Once | INTRAMUSCULAR | Status: DC

## 2015-06-25 MED ORDER — SODIUM CHLORIDE 0.9% FLUSH
10.0000 mL | INTRAVENOUS | Status: AC | PRN
  Administered 2015-06-25: 10 mL
  Filled 2015-06-25: qty 10

## 2015-06-25 MED ORDER — HEPARIN SOD (PORK) LOCK FLUSH 100 UNIT/ML IV SOLN
500.0000 [IU] | Freq: Every day | INTRAVENOUS | Status: AC | PRN
  Administered 2015-06-25: 500 [IU]
  Filled 2015-06-25: qty 5

## 2015-06-25 NOTE — Progress Notes (Signed)
B/P 81/41, Ned Card NP aware. Per Lattie Haw NP, notify Lattie Haw NP or Dr. Benay Spice of VS at the end of blood transfusion, at which time a decision will be made regarding pt receiving Lasix for patient's leg swelling.  Pt aware and verbalizes understanding.

## 2015-06-25 NOTE — Patient Instructions (Signed)

## 2015-06-26 LAB — TYPE AND SCREEN
ABO/RH(D): O POS
ANTIBODY SCREEN: NEGATIVE
UNIT DIVISION: 0
Unit division: 0

## 2015-06-27 ENCOUNTER — Encounter: Payer: Self-pay | Admitting: Oncology

## 2015-06-27 NOTE — Progress Notes (Signed)
fmla-matrix faxed.left for dr signature

## 2015-06-28 ENCOUNTER — Encounter: Payer: Self-pay | Admitting: Oncology

## 2015-06-28 NOTE — Progress Notes (Signed)
faxed [phone] and left patient copy at front with ms wilma

## 2015-06-30 ENCOUNTER — Other Ambulatory Visit: Payer: Self-pay | Admitting: Oncology

## 2015-07-01 ENCOUNTER — Telehealth: Payer: Self-pay | Admitting: *Deleted

## 2015-07-01 NOTE — Telephone Encounter (Signed)
FU TC to Pt. To see how he is feeling. Pt. Stated that he is still feeling a little sob and fatigued and still has diarrhea. Asked Pt. If he would like to come in Pt. Stated that he was Jeremy Moody will be here tomorrow at 9 am. Encouraged Pt. To stay hydrated and if symptoms should get worse to go to the ER. Pt. Verbalized understanding. Appreciated the follow up call.

## 2015-07-01 NOTE — Telephone Encounter (Signed)
"  Could you let Jeremy Moody know I do not want chemotherapy tomorrow.  I would like to put it off another week.  I'm weak and tired and can't do it tomorrow.  I have constant diarrhea.  No nausea and vomiting.  I use imodium and pancreatic enzyme."  Offered symptom management today.  Denies need to come in today.  "Let's just keep tomorrow's lab and F/u appointment with Lattie Haw."

## 2015-07-02 ENCOUNTER — Ambulatory Visit: Payer: 59

## 2015-07-02 ENCOUNTER — Ambulatory Visit (HOSPITAL_COMMUNITY)
Admission: RE | Admit: 2015-07-02 | Discharge: 2015-07-02 | Disposition: A | Discharge status: Home / Self Care | Payer: 59 | Source: Ambulatory Visit | Attending: Nurse Practitioner | Admitting: Nurse Practitioner

## 2015-07-02 ENCOUNTER — Other Ambulatory Visit (HOSPITAL_BASED_OUTPATIENT_CLINIC_OR_DEPARTMENT_OTHER): Payer: 59

## 2015-07-02 ENCOUNTER — Telehealth: Payer: Self-pay | Admitting: Oncology

## 2015-07-02 ENCOUNTER — Ambulatory Visit (HOSPITAL_BASED_OUTPATIENT_CLINIC_OR_DEPARTMENT_OTHER): Payer: 59 | Admitting: Nurse Practitioner

## 2015-07-02 VITALS — BP 134/60 | HR 95 | Temp 97.8°F | Resp 18 | Ht 71.0 in | Wt 218.9 lb

## 2015-07-02 DIAGNOSIS — C250 Malignant neoplasm of head of pancreas: Secondary | ICD-10-CM

## 2015-07-02 DIAGNOSIS — R188 Other ascites: Secondary | ICD-10-CM

## 2015-07-02 DIAGNOSIS — R600 Localized edema: Secondary | ICD-10-CM

## 2015-07-02 DIAGNOSIS — C787 Secondary malignant neoplasm of liver and intrahepatic bile duct: Secondary | ICD-10-CM

## 2015-07-02 DIAGNOSIS — C259 Malignant neoplasm of pancreas, unspecified: Secondary | ICD-10-CM | POA: Diagnosis not present

## 2015-07-02 DIAGNOSIS — R0600 Dyspnea, unspecified: Secondary | ICD-10-CM

## 2015-07-02 DIAGNOSIS — D72829 Elevated white blood cell count, unspecified: Secondary | ICD-10-CM

## 2015-07-02 DIAGNOSIS — R530 Neoplastic (malignant) related fatigue: Secondary | ICD-10-CM

## 2015-07-02 DIAGNOSIS — K839 Disease of biliary tract, unspecified: Secondary | ICD-10-CM | POA: Diagnosis not present

## 2015-07-02 LAB — CBC WITH DIFFERENTIAL/PLATELET
BASO%: 0.5 % (ref 0.0–2.0)
BASOS ABS: 0.1 10*3/uL (ref 0.0–0.1)
EOS%: 0.2 % (ref 0.0–7.0)
Eosinophils Absolute: 0.1 10*3/uL (ref 0.0–0.5)
HCT: 31.7 % — ABNORMAL LOW (ref 38.4–49.9)
HGB: 10.8 g/dL — ABNORMAL LOW (ref 13.0–17.1)
LYMPH%: 17.5 % (ref 14.0–49.0)
MCH: 33.5 pg — AB (ref 27.2–33.4)
MCHC: 34.1 g/dL (ref 32.0–36.0)
MCV: 98.4 fL — ABNORMAL HIGH (ref 79.3–98.0)
MONO#: 2.8 10*3/uL — AB (ref 0.1–0.9)
MONO%: 13.4 % (ref 0.0–14.0)
NEUT#: 14.4 10*3/uL — ABNORMAL HIGH (ref 1.5–6.5)
NEUT%: 68.4 % (ref 39.0–75.0)
Platelets: 274 10*3/uL (ref 140–400)
RBC: 3.22 10*6/uL — AB (ref 4.20–5.82)
RDW: 22.7 % — ABNORMAL HIGH (ref 11.0–14.6)
WBC: 21 10*3/uL — ABNORMAL HIGH (ref 4.0–10.3)
lymph#: 3.7 10*3/uL — ABNORMAL HIGH (ref 0.9–3.3)
nRBC: 1 % — ABNORMAL HIGH (ref 0–0)

## 2015-07-02 LAB — COMPREHENSIVE METABOLIC PANEL
ALT: 17 U/L (ref 0–55)
AST: 44 U/L — AB (ref 5–34)
Albumin: 2.1 g/dL — ABNORMAL LOW (ref 3.5–5.0)
Alkaline Phosphatase: 259 U/L — ABNORMAL HIGH (ref 40–150)
Anion Gap: 7 mEq/L (ref 3–11)
BUN: 13.5 mg/dL (ref 7.0–26.0)
CALCIUM: 8.2 mg/dL — AB (ref 8.4–10.4)
CHLORIDE: 107 meq/L (ref 98–109)
CO2: 24 mEq/L (ref 22–29)
Creatinine: 0.9 mg/dL (ref 0.7–1.3)
EGFR: 85 mL/min/{1.73_m2} — ABNORMAL LOW (ref >90)
GLUCOSE: 91 mg/dL (ref 70–140)
POTASSIUM: 3.5 meq/L (ref 3.5–5.1)
SODIUM: 138 meq/L (ref 136–145)
Total Bilirubin: 2.58 mg/dL — ABNORMAL HIGH (ref 0.20–1.20)
Total Protein: 5.9 g/dL — ABNORMAL LOW (ref 6.4–8.3)

## 2015-07-02 LAB — GRAM STAIN

## 2015-07-02 LAB — URINALYSIS, MICROSCOPIC - CHCC
BLOOD: NEGATIVE
Bacteria, UA: NEGATIVE
Bilirubin (Urine): UNDETERMINED
Glucose: NEGATIVE mg/dL
Ketones: NEGATIVE mg/dL
Leukocyte Esterase: NEGATIVE
NITRITE: NEGATIVE
Protein: NEGATIVE mg/dL
RBC / HPF: NEGATIVE (ref 0–2)
Specific Gravity, Urine: 1.02 (ref 1.003–1.035)
UROBILINOGEN UR: 0.2 mg/dL (ref 0.2–1)
pH: 5 (ref 4.6–8.0)

## 2015-07-02 LAB — TECHNOLOGIST REVIEW

## 2015-07-02 MED ORDER — SODIUM CHLORIDE 0.9% FLUSH
10.0000 mL | INTRAVENOUS | Status: DC | PRN
Start: 2015-07-02 — End: 2015-07-02
  Filled 2015-07-02: qty 10

## 2015-07-02 NOTE — Telephone Encounter (Signed)
cld & spoke to pt and gave pt appt for 3/16 @ 12-adv [facility] will call to sch scan and paranth

## 2015-07-02 NOTE — Procedures (Signed)
Ultrasound-guided diagnostic and therapeutic paracentesis performed yielding 4  liters of turbid, yellow  fluid. No immediate complications. A portion of the fluid was sent to the lab for preordered studies.

## 2015-07-02 NOTE — Progress Notes (Signed)
[place] OFFICE PROGRESS NOTE   Diagnosis:   Pancreas cancer  INTERVAL HISTORY:   Jeremy Moody returns as scheduled. He completed cycle 4 gemcitabine/Abraxane 06/18/2015. He was transfused a unit of blood on 06/24/2015 and a second unit on 06/25/2015. He does not feel up to chemotherapy today. He notes increased abdominal distention. He feels this is making him more short of breath because he cannot take a deep breath. No chest pain. He continues to have bilateral leg swelling. He resumed Lasix 20 mg twice daily last week. No fever. No significant diarrhea. Appetite is fair. He feels "weak".  Objective:  Vital signs in last 24 hours:  Blood pressure 134/60, pulse 95, temperature 97.8 F (36.6 C), temperature source Oral, resp. rate 18, height 5\' 11"  (1.803 m), weight 218 lb 14.4 oz (99.292 kg), SpO2 100 %.   Fatigued appearing man in no acute distress. HEENT: No thrush or ulcers. Mucous membranes appear moist. Resp: Lungs clear bilaterally. No respiratory distress. Cardio: Regular rate and rhythm. No JVD. GI: Abdomen is markedly distended. He appears to have ascites. Vascular: Pitting edema throughout both legs. Skin: Port-A-Cath without erythema.    Lab Results:  Lab Results  Component Value Date   WBC 21.0* 07/02/2015   HGB 10.8* 07/02/2015   HCT 31.7* 07/02/2015   MCV 98.4* 07/02/2015   PLT 274 07/02/2015   NEUTROABS 14.4* 07/02/2015    Imaging:  No results found.  Medications: I have reviewed the patient's current medications.  Assessment/Plan: 1. Pancreas cancer, stage IV  CT abdomen/pelvis 04/29/2015 revealed a pancreas uncinate mass, liver metastases, lung nodules, extensive retroperitoneal/mesenteric, pelvic, and low mediastinal lymphadenopathy  Ultrasound-guided biopsy of a right liver lesion 05/02/2015 confirmed metastatic adenocarcinoma  Cycle 1 gemcitabine/Abraxane 05/07/2015  Cycle 2 gemcitabine/Abraxane 05/21/2015 (50% dose  reduction)  Cycle 3 gemcitabine/Abraxane 06/05/2015  Cycle 4 gemcitabine/Abraxane 06/18/2015  2. Anorexia/weight loss  3. Abdominal/back pain secondary to the pancreas mass and liver metastases-improved  4. History of atrial fibrillation and flutter  5. [place] likely secondary to pancreatic insufficiency, improved with pancreatic enzyme replacement  6. Oral candidiasis 05/03/2015-treated with Diflucan; recurrent oral candidiasis 05/28/2015. Treated with Diflucan.  7. Hyperbilirubinemia.   CT abdomen/pelvis 05/14/2015 with no biliary duct dilatation. Mild lateral segment left liver lobe intrahepatic duct dilatation similar and likely secondary to a central left liver lobe metastasis. Decreased size of pancreatic uncinate process primary. Mild improvement in hepatic metastasis. Similar abdominal nodal metastasis. Porta hepatis node larger. Likely smimlar pulmonary metastasis. Increase in small volume of abdominal ascites.  Labs improved 05/21/2015; further improved 05/28/2015, 06/05/2015 and 06/18/2015  8. Hypotension. Metoprolol adjusted to 12.5 mg twice daily 05/21/2015.  Metoprolol discontinued 06/25/2015.   Disposition: Jeremy Moody has completed 4 cycles of gemcitabine/Abraxane. He has increased abdominal distention which is likely contributing to the shortness of breath. On exam he appears to have significant ascites. I am referring him for an abdominal ultrasound with plans for a diagnostic/therapeutic paracentesis if significant ascites is confirmed.  The white count is elevated. We will obtain a urinalysis, urine culture and blood culture. Of note, he is afebrile and there is no localizing source for infection based on the history, physical exam.  He is scheduled for cycle 5 gemcitabine/Abraxane today. We will hold today's treatment.  He will return for a follow-up visit on 07/04/2015. He understands to contact the office in the interim or to seek  emergency evaluation if the dyspnea worsens.  I discussed his case with Dr. Burr Medico. Plan to  proceed as above. 25 minutes were spent face-to-face at today's visit with the majority of that time involved in counseling/coordination of care.  Ned Card ANP/GNP-BC   07/02/2015  9:40 AM

## 2015-07-03 ENCOUNTER — Telehealth: Payer: Self-pay | Admitting: Nurse Practitioner

## 2015-07-03 ENCOUNTER — Other Ambulatory Visit: Payer: Self-pay | Admitting: *Deleted

## 2015-07-03 LAB — CANCER ANTIGEN 19-9: CA 19-9: 27211 U/mL — ABNORMAL HIGH (ref 0–35)

## 2015-07-03 LAB — URINE CULTURE: ORGANISM ID, BACTERIA: NO GROWTH

## 2015-07-03 MED FILL — FLECAINIDE ACETATE 100 MG T: 100 | 30 days supply | Qty: 60 | Fill #1

## 2015-07-03 NOTE — Progress Notes (Signed)
Pt called requesting Lomotil prescription d/t increased diarrhea.  Pt seen by L. Thomas NP on 07/02/15 and reported no diarrhea at that time.  Call placed to pt by this RN to further assess situation, pt states that he has been having diarrhea 2-3 times a day and that diarrhea usually occurs after he eats.  Dr. Benay Spice notified and order received to have pt increase his Creon pills to 3 tablets prior to each meal for today.  Pt to be seen by Dr. Benay Spice tomorrow and diarrhea status will be reassessed at that time.  Pt agreeable to plan and has no further questions at this time.

## 2015-07-03 NOTE — Telephone Encounter (Signed)
As per Owens Shark NP FU TC was placed to Pt. To see how he was feeling today. Pt. Stated 4 liters of fluid was removed yesterday and that he is feeling much better today, He also stated that he can breathe much better. Pt. Appreciated FU call. Knows if anything should change to call [place].

## 2015-07-04 ENCOUNTER — Ambulatory Visit (HOSPITAL_BASED_OUTPATIENT_CLINIC_OR_DEPARTMENT_OTHER): Payer: 59 | Admitting: Oncology

## 2015-07-04 ENCOUNTER — Other Ambulatory Visit: Payer: Self-pay | Admitting: *Deleted

## 2015-07-04 ENCOUNTER — Telehealth: Payer: Self-pay | Admitting: *Deleted

## 2015-07-04 ENCOUNTER — Telehealth: Payer: Self-pay | Admitting: Oncology

## 2015-07-04 ENCOUNTER — Ambulatory Visit: Payer: 59 | Admitting: Nurse Practitioner

## 2015-07-04 VITALS — BP 97/47 | HR 92 | Temp 97.9°F | Resp 18 | Ht 71.0 in | Wt 209.1 lb

## 2015-07-04 DIAGNOSIS — R197 Diarrhea, unspecified: Secondary | ICD-10-CM | POA: Diagnosis not present

## 2015-07-04 DIAGNOSIS — C787 Secondary malignant neoplasm of liver and intrahepatic bile duct: Secondary | ICD-10-CM

## 2015-07-04 DIAGNOSIS — C250 Malignant neoplasm of head of pancreas: Secondary | ICD-10-CM

## 2015-07-04 DIAGNOSIS — M549 Dorsalgia, unspecified: Secondary | ICD-10-CM

## 2015-07-04 DIAGNOSIS — R109 Unspecified abdominal pain: Secondary | ICD-10-CM

## 2015-07-04 DIAGNOSIS — R188 Other ascites: Secondary | ICD-10-CM | POA: Diagnosis not present

## 2015-07-04 MED ORDER — DIPHENOXYLATE-ATROPINE 2.5-0.025 MG PO TABS: 1.0000 | ORAL_TABLET | Freq: Four times a day (QID) | ORAL | Status: DC | PRN

## 2015-07-04 MED FILL — DIPHENOXYLATE-ATROPINE TAB: 2.5-0.025 | 8 days supply | Qty: 60 | Fill #0

## 2015-07-04 NOTE — Progress Notes (Signed)
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 Chemotherapy was held  07/02/2015 secondary to increased abdominal distention. He underwent a paracentesis and 4 L of ascites were removed. The cytology revealed reactive mesothelial cells.    Dr. Redmond Pulling reports feeling much better after the paracentesis. The dyspnea has improved. He has noted increased urine output with Lasix. He takes approximately 2 hydrocodone tablets per day for relief of pain.  He has noted increased diarrhea following meals. He has difficulty swallowing the Creon tablets.     Objective:  Vital signs in last 24 hours:  Blood pressure 97/47, pulse 92, temperature 97.9 F (36.6 C), temperature source Oral, resp. rate 18, height 5\' 11"  (1.803 m), weight 209 lb 1.6 oz (94.847 kg), SpO2 98 %.   Resp:  Lungs clear bilaterally Cardio:  Regular rate and rhythm GI:  Mildly distended, no hepatomegaly Vascular:  1+ edema throughout the legs bilaterally with chronic stasis change at the lower legs    Portacath/PICC-without erythema  Lab Results:  Lab Results  Component Value Date   WBC 21.0* 07/02/2015   HGB 10.8* 07/02/2015   HCT 31.7* 07/02/2015   MCV 98.4* 07/02/2015   PLT 274 07/02/2015   NEUTROABS 14.4* 07/02/2015     CA 19-9 on 07/02/2015: 27,211  Imaging:  US Abdomen Complete  07/02/2015  CLINICAL DATA:  Pancreatic cancer with abdominal distention. EXAM: ABDOMEN ULTRASOUND COMPLETE COMPARISON:  CT of 05/14/2015 FINDINGS: Gallbladder: Mild gallbladder sludge. No wall thickening or pericholecystic fluid. Sonographic Murphy's sign was not elicited. Common bile duct: Diameter: Normal, 4 mm. Liver: Multiple hepatic metastasis, as on prior CT. IVC: No abnormality visualized. Pancreas: Suboptimally evaluated secondary to patient discomfort. No gross pancreatic duct dilatation identified. Pancreatic uncinate process primary is  not well visualized. Spleen: Size and appearance within normal limits. Right Kidney: Length: 11.9 cm. Echogenicity within normal limits. No mass or hydronephrosis visualized. Left Kidney: Length: 10.3 cm. Echogenicity within normal limits. No mass or hydronephrosis visualized. Abdominal aorta: No aneurysm visualized. Other findings: No significant ascites. IMPRESSION: 1. No significant ascites identified. 2. Metastatic pancreatic cancer, as before. 3. Gallbladder sludge without acute cholecystitis. Electronically Signed   By: Abigail Miyamoto M.D.   On: 07/02/2015 14:01   US Paracentesis  07/02/2015  INDICATION: Pancreatic cancer, ascites. Request made for diagnostic and therapeutic paracentesis up to 5 liters. EXAM: ULTRASOUND GUIDED THERAPEUTIC PARACENTESIS MEDICATIONS: None. COMPLICATIONS: None immediate. PROCEDURE: Informed written consent was obtained from the patient after a discussion of the risks, benefits and alternatives to treatment. A timeout was performed prior to the initiation of the procedure. Initial ultrasound scanning demonstrates a moderate to large amount of ascites within the right lower abdominal quadrant. The right lower abdomen was prepped and draped in the usual sterile fashion. 1% lidocaine was used for local anesthesia. Following this, a Yueh catheter was introduced. An ultrasound image was saved for documentation purposes. The paracentesis was performed. The catheter was removed and a dressing was applied. The patient tolerated the procedure well without immediate post procedural complication. FINDINGS: A total of approximately 4 liters of turbid, yellow fluid was removed. Samples were sent to the laboratory as requested by the clinical team. IMPRESSION: Successful ultrasound-guided therapeutic paracentesis yielding 4 liters of peritoneal fluid. Read by: Rowe Robert, PA-C Electronically Signed   By: Jacqulynn Cadet M.D.   On: 07/02/2015 13:36    Medications: I have reviewed the  patient's current medications.  Assessment/Plan: 1. Pancreas cancer, stage IV  CT abdomen/pelvis 04/29/2015 revealed a pancreas uncinate mass, liver metastases, lung nodules, extensive retroperitoneal/mesenteric, pelvic, and low mediastinal lymphadenopathy  Ultrasound-guided biopsy of a right liver lesion 05/02/2015 confirmed metastatic adenocarcinoma  Cycle 1 gemcitabine/Abraxane 05/07/2015  Cycle 2 gemcitabine/Abraxane 05/21/2015 (50% dose reduction)  Cycle 3 gemcitabine/Abraxane 06/05/2015  Cycle 4 gemcitabine/Abraxane 06/18/2015  2. Anorexia/weight loss  3. Abdominal/back pain secondary to the pancreas mass and liver metastases-improved  4. History of atrial fibrillation and flutter  5. [place] likely secondary to pancreatic insufficiency, he will increase the pancreatic enzyme replacement and try Lomotil  6. Oral candidiasis 05/03/2015-treated with Diflucan; recurrent oral candidiasis 05/28/2015. Treated with Diflucan.  7. Hyperbilirubinemia.   CT abdomen/pelvis 05/14/2015 with no biliary duct dilatation. Mild lateral segment left liver lobe intrahepatic duct dilatation similar and likely secondary to a central left liver lobe metastasis. Decreased size of pancreatic uncinate process primary. Mild improvement in hepatic metastasis. Similar abdominal nodal metastasis. Porta hepatis node larger. Likely smimlar pulmonary metastasis. Increase in small volume of abdominal ascites.  Labs improved 05/21/2015; further improved 05/28/2015, 06/05/2015 and 06/18/2015  8. Hypotension. Metoprolol adjusted to 12.5 mg twice daily 05/21/2015. Metoprolol discontinued 06/25/2015.  9.     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 He will continue Lasix. He'll contact us for recurrent ascites and we will arrange for  another palliative paracentesis.  He would like to wait on resuming gemcitabine/Abraxane until 07/09/2015.   The diarrhea after meals is most likely secondary to pancreas insufficiency. He will increase the Creon by 1 tablet with each meal. He will use Lomotil as needed.   He will be scheduled for an office visit and chemotherapy on 07/23/2015. The CA 19-9 continues to improve. We will plan for a restaging CT evaluation in April.  Betsy Coder, MD  07/04/2015  12:32 PM

## 2015-07-04 NOTE — Telephone Encounter (Signed)
Pt aware of appt date/time for infusion 3/21

## 2015-07-04 NOTE — Telephone Encounter (Signed)
sch appt per 3/16 pof. [place] sent to [facility] to advise on 3/21 chemo date

## 2015-07-04 NOTE — Telephone Encounter (Signed)
Per staff message and POF I have scheduled appts. Advised scheduler of appts. JMW  

## 2015-07-05 ENCOUNTER — Telehealth: Payer: Self-pay | Admitting: Oncology

## 2015-07-05 ENCOUNTER — Other Ambulatory Visit: Payer: Self-pay | Admitting: *Deleted

## 2015-07-05 MED ORDER — POTASSIUM CHLORIDE 40 MEQ/15ML (20%) PO SOLN: 7.5000 mL | Freq: Two times a day (BID) | ORAL | Status: DC

## 2015-07-05 MED FILL — POTASSIUM CL 20% (40 MEQ/15: 40 MEQ/15ML | 31 days supply | Qty: 473 | Fill #0

## 2015-07-05 NOTE — Telephone Encounter (Signed)
Spoke with patient to inform him of 3/21 appt times for lab/fl/rx

## 2015-07-07 LAB — CULTURE, BODY FLUID W GRAM STAIN -BOTTLE: Culture: NO GROWTH

## 2015-07-07 LAB — CULTURE, BODY FLUID-BOTTLE

## 2015-07-09 ENCOUNTER — Ambulatory Visit: Payer: 59

## 2015-07-09 ENCOUNTER — Other Ambulatory Visit (HOSPITAL_BASED_OUTPATIENT_CLINIC_OR_DEPARTMENT_OTHER): Payer: 59

## 2015-07-09 ENCOUNTER — Ambulatory Visit (HOSPITAL_BASED_OUTPATIENT_CLINIC_OR_DEPARTMENT_OTHER): Payer: 59

## 2015-07-09 VITALS — BP 117/61 | HR 82 | Temp 98.5°F | Resp 18

## 2015-07-09 DIAGNOSIS — Z5111 Encounter for antineoplastic chemotherapy: Secondary | ICD-10-CM | POA: Diagnosis not present

## 2015-07-09 DIAGNOSIS — C250 Malignant neoplasm of head of pancreas: Secondary | ICD-10-CM | POA: Diagnosis not present

## 2015-07-09 DIAGNOSIS — K8689 Other specified diseases of pancreas: Secondary | ICD-10-CM

## 2015-07-09 DIAGNOSIS — Z95828 Presence of other vascular implants and grafts: Secondary | ICD-10-CM

## 2015-07-09 LAB — CBC WITH DIFFERENTIAL/PLATELET
BASO%: 0.4 % (ref 0.0–2.0)
BASOS ABS: 0.1 10*3/uL (ref 0.0–0.1)
EOS%: 0.3 % (ref 0.0–7.0)
Eosinophils Absolute: 0 10*3/uL (ref 0.0–0.5)
HEMATOCRIT: 28.9 % — AB (ref 38.4–49.9)
HGB: 9.6 g/dL — ABNORMAL LOW (ref 13.0–17.1)
LYMPH%: 14.1 % (ref 14.0–49.0)
MCH: 34.5 pg — AB (ref 27.2–33.4)
MCHC: 33.4 g/dL (ref 32.0–36.0)
MCV: 103.5 fL — ABNORMAL HIGH (ref 79.3–98.0)
MONO#: 2 10*3/uL — ABNORMAL HIGH (ref 0.1–0.9)
MONO%: 14 % (ref 0.0–14.0)
NEUT#: 10.3 10*3/uL — ABNORMAL HIGH (ref 1.5–6.5)
NEUT%: 71.2 % (ref 39.0–75.0)
Platelets: 327 10*3/uL (ref 140–400)
RBC: 2.79 10*6/uL — ABNORMAL LOW (ref 4.20–5.82)
RDW: 26.5 % — ABNORMAL HIGH (ref 11.0–14.6)
WBC: 14.5 10*3/uL — ABNORMAL HIGH (ref 4.0–10.3)
lymph#: 2 10*3/uL (ref 0.9–3.3)

## 2015-07-09 LAB — COMPREHENSIVE METABOLIC PANEL
ALK PHOS: 171 U/L — AB (ref 40–150)
ALT: 12 U/L (ref 0–55)
AST: 36 U/L — AB (ref 5–34)
Albumin: 1.9 g/dL — ABNORMAL LOW (ref 3.5–5.0)
Anion Gap: 6 mEq/L (ref 3–11)
BILIRUBIN TOTAL: 1.99 mg/dL — AB (ref 0.20–1.20)
BUN: 12.1 mg/dL (ref 7.0–26.0)
CALCIUM: 7.8 mg/dL — AB (ref 8.4–10.4)
CO2: 24 mEq/L (ref 22–29)
Chloride: 106 mEq/L (ref 98–109)
Creatinine: 0.8 mg/dL (ref 0.7–1.3)
EGFR: 89 mL/min/{1.73_m2} — AB (ref >90)
GLUCOSE: 89 mg/dL (ref 70–140)
Potassium: 3.2 mEq/L — ABNORMAL LOW (ref 3.5–5.1)
SODIUM: 137 meq/L (ref 136–145)
Total Protein: 5.6 g/dL — ABNORMAL LOW (ref 6.4–8.3)

## 2015-07-09 LAB — CULTURE, BLOOD (SINGLE)

## 2015-07-09 LAB — TECHNOLOGIST REVIEW

## 2015-07-09 MED ORDER — SODIUM CHLORIDE 0.9 % IJ SOLN
10.0000 mL | INTRAMUSCULAR | Status: DC | PRN
  Administered 2015-07-09: 10 mL
  Filled 2015-07-09: qty 10

## 2015-07-09 MED ORDER — HEPARIN SOD (PORK) LOCK FLUSH 100 UNIT/ML IV SOLN
500.0000 [IU] | Freq: Once | INTRAVENOUS | Status: AC | PRN
  Administered 2015-07-09: 500 [IU]
  Filled 2015-07-09: qty 5

## 2015-07-09 MED ORDER — PACLITAXEL PROTEIN-BOUND CHEMO INJECTION 100 MG
50.0000 mg/m2 | Freq: Once | INTRAVENOUS | Status: AC
  Administered 2015-07-09: 100 mg via INTRAVENOUS
  Filled 2015-07-09: qty 20

## 2015-07-09 MED ORDER — SODIUM CHLORIDE 0.9 % IV SOLN
10.0000 mg | Freq: Once | INTRAVENOUS | Status: AC
  Administered 2015-07-09: 10 mg via INTRAVENOUS
  Filled 2015-07-09: qty 1

## 2015-07-09 MED ORDER — GEMCITABINE HCL CHEMO INJECTION 1 GM/26.3ML
400.0000 mg/m2 | Freq: Once | INTRAVENOUS | Status: AC
  Administered 2015-07-09: 836 mg via INTRAVENOUS
  Filled 2015-07-09: qty 21.99

## 2015-07-09 MED ORDER — PALONOSETRON HCL INJECTION 0.25 MG/5ML
0.2500 mg | Freq: Once | INTRAVENOUS | Status: AC
  Administered 2015-07-09: 0.25 mg via INTRAVENOUS

## 2015-07-09 MED ORDER — SODIUM CHLORIDE 0.9% FLUSH
10.0000 mL | INTRAVENOUS | Status: DC | PRN
  Administered 2015-07-09: 10 mL via INTRAVENOUS
  Filled 2015-07-09: qty 10

## 2015-07-09 MED ORDER — SODIUM CHLORIDE 0.9 % IV SOLN
Freq: Once | INTRAVENOUS | Status: AC
  Administered 2015-07-09: 14:00:00 via INTRAVENOUS

## 2015-07-09 MED ORDER — HYDROCODONE-ACETAMINOPHEN 5-325 MG PO TABS: 1.0000 | ORAL_TABLET | ORAL | Status: DC | PRN

## 2015-07-09 MED ORDER — PALONOSETRON HCL INJECTION 0.25 MG/5ML
INTRAVENOUS | Status: AC
  Filled 2015-07-09: qty 5

## 2015-07-09 MED FILL — HYDROCODON-APAP 5-325: 5-325 | 13 days supply | Qty: 75 | Fill #0

## 2015-07-09 NOTE — Progress Notes (Signed)
OK to treat today with total bili-1.99 per Dr. Benay Spice.

## 2015-07-09 NOTE — Patient Instructions (Signed)

## 2015-07-13 ENCOUNTER — Emergency Department (HOSPITAL_COMMUNITY)
Admission: EM | Admit: 2015-07-13 | Discharge: 2015-07-14 | Disposition: A | Discharge status: Home / Self Care | Payer: 59 | Attending: Emergency Medicine | Admitting: Emergency Medicine

## 2015-07-13 ENCOUNTER — Other Ambulatory Visit: Payer: Self-pay

## 2015-07-13 ENCOUNTER — Encounter (HOSPITAL_COMMUNITY): Payer: Self-pay

## 2015-07-13 ENCOUNTER — Emergency Department (HOSPITAL_COMMUNITY): Payer: 59

## 2015-07-13 DIAGNOSIS — Z79899 Other long term (current) drug therapy: Secondary | ICD-10-CM | POA: Insufficient documentation

## 2015-07-13 DIAGNOSIS — Z862 Personal history of diseases of the blood and blood-forming organs and certain disorders involving the immune mechanism: Secondary | ICD-10-CM | POA: Insufficient documentation

## 2015-07-13 DIAGNOSIS — R188 Other ascites: Secondary | ICD-10-CM | POA: Diagnosis not present

## 2015-07-13 DIAGNOSIS — Z8507 Personal history of malignant neoplasm of pancreas: Secondary | ICD-10-CM | POA: Insufficient documentation

## 2015-07-13 DIAGNOSIS — R000 Tachycardia, unspecified: Secondary | ICD-10-CM | POA: Diagnosis not present

## 2015-07-13 DIAGNOSIS — R231 Pallor: Secondary | ICD-10-CM | POA: Diagnosis not present

## 2015-07-13 DIAGNOSIS — R0602 Shortness of breath: Secondary | ICD-10-CM | POA: Diagnosis not present

## 2015-07-13 DIAGNOSIS — D649 Anemia, unspecified: Secondary | ICD-10-CM | POA: Diagnosis not present

## 2015-07-13 DIAGNOSIS — Z7982 Long term (current) use of aspirin: Secondary | ICD-10-CM | POA: Insufficient documentation

## 2015-07-13 HISTORY — DX: Anemia, unspecified: D64.9

## 2015-07-13 HISTORY — DX: Malignant neoplasm of pancreas, unspecified: C25.9

## 2015-07-13 LAB — CBC WITH DIFFERENTIAL/PLATELET
BASOS ABS: 0 10*3/uL (ref 0.0–0.1)
BASOS PCT: 0 %
Eosinophils Absolute: 0 10*3/uL (ref 0.0–0.7)
Eosinophils Relative: 0 %
HEMATOCRIT: 24.5 % — AB (ref 39.0–52.0)
HEMOGLOBIN: 8.7 g/dL — AB (ref 13.0–17.0)
LYMPHS PCT: 6 %
Lymphs Abs: 1 10*3/uL (ref 0.7–4.0)
MCH: 35.5 pg — ABNORMAL HIGH (ref 26.0–34.0)
MCHC: 35.5 g/dL (ref 30.0–36.0)
MCV: 100 fL (ref 78.0–100.0)
MONOS PCT: 0 %
Monocytes Absolute: 0 10*3/uL — ABNORMAL LOW (ref 0.1–1.0)
NEUTROS ABS: 15.1 10*3/uL — AB (ref 1.7–7.7)
NEUTROS PCT: 94 %
Platelets: 173 10*3/uL (ref 150–400)
RBC: 2.45 MIL/uL — ABNORMAL LOW (ref 4.22–5.81)
RDW: 24.1 % — ABNORMAL HIGH (ref 11.5–15.5)
WBC: 16.1 10*3/uL — ABNORMAL HIGH (ref 4.0–10.5)

## 2015-07-13 LAB — COMPREHENSIVE METABOLIC PANEL
ALBUMIN: 2 g/dL — AB (ref 3.5–5.0)
ALK PHOS: 145 U/L — AB (ref 38–126)
ALT: 17 U/L (ref 17–63)
ANION GAP: 7 (ref 5–15)
AST: 39 U/L (ref 15–41)
BILIRUBIN TOTAL: 2.2 mg/dL — AB (ref 0.3–1.2)
BUN: 14 mg/dL (ref 6–20)
CALCIUM: 7.9 mg/dL — AB (ref 8.9–10.3)
CO2: 23 mmol/L (ref 22–32)
CREATININE: 0.69 mg/dL (ref 0.61–1.24)
Chloride: 105 mmol/L (ref 101–111)
GFR calc Af Amer: >60 mL/min (ref >60)
GFR calc non Af Amer: >60 mL/min (ref >60)
GLUCOSE: 102 mg/dL — AB (ref 65–99)
Potassium: 3.7 mmol/L (ref 3.5–5.1)
SODIUM: 135 mmol/L (ref 135–145)
TOTAL PROTEIN: 5.5 g/dL — AB (ref 6.5–8.1)

## 2015-07-13 LAB — I-STAT CG4 LACTIC ACID, ED: Lactic Acid, Venous: 1.37 mmol/L (ref 0.5–2.0)

## 2015-07-13 MED ORDER — MORPHINE SULFATE (PF) 4 MG/ML IV SOLN
4.0000 mg | Freq: Once | INTRAVENOUS | Status: AC
  Administered 2015-07-13: 4 mg via INTRAVENOUS
  Filled 2015-07-13: qty 1

## 2015-07-13 MED ORDER — HEPARIN SOD (PORK) LOCK FLUSH 100 UNIT/ML IV SOLN
500.0000 [IU] | Freq: Once | INTRAVENOUS | Status: AC
  Administered 2015-07-13: 500 [IU]
  Filled 2015-07-13: qty 5

## 2015-07-13 MED ORDER — SODIUM CHLORIDE 0.9 % IV BOLUS (SEPSIS)
500.0000 mL | Freq: Once | INTRAVENOUS | Status: AC
  Administered 2015-07-13: 500 mL via INTRAVENOUS

## 2015-07-13 MED ORDER — SODIUM CHLORIDE 0.9 % IV SOLN
INTRAVENOUS | Status: DC
  Administered 2015-07-13: 20:00:00 via INTRAVENOUS

## 2015-07-13 MED ORDER — LORAZEPAM 2 MG/ML IJ SOLN
0.5000 mg | Freq: Once | INTRAMUSCULAR | Status: AC
  Administered 2015-07-13: 0.5 mg via INTRAVENOUS
  Filled 2015-07-13: qty 1

## 2015-07-13 MED ORDER — SODIUM CHLORIDE 0.9 % IV BOLUS (SEPSIS)
1000.0000 mL | Freq: Once | INTRAVENOUS | Status: AC
  Administered 2015-07-13: 1000 mL via INTRAVENOUS

## 2015-07-13 NOTE — ED Notes (Signed)
Pt c/o worsening pain and requesting pain meds. MD aware.

## 2015-07-13 NOTE — Discharge Instructions (Signed)
Ascites °Ascites is a collection of excess fluid in the abdomen. Ascites can range from mild to severe. It can get worse without treatment. °CAUSES °Possible causes include: °· Cirrhosis. This is the most common cause of ascites. °· Infection or inflammation in the abdomen. °· Cancer in the abdomen. °· Heart failure. °· Kidney disease. °· Inflammation of the pancreas. °· Clots in the veins of the liver. °SIGNS AND SYMPTOMS °Signs and symptoms may include: °· A feeling of fullness in your abdomen. This is common. °· An increase in the size of your abdomen or your waist. °· Swelling in your legs. °· Swelling of the scrotum in men. °· Difficulty breathing. °· Abdominal pain. °· Sudden weight gain. °If the condition is mild, you may not have symptoms. °DIAGNOSIS °To make a diagnosis, your health care provider will: °· Ask about your medical history. °· Perform a physical exam. °· Order imaging tests, such as an ultrasound or CT scan of your abdomen. °TREATMENT °Treatment depends on the cause of the ascites. It may include: °· Taking a pill to make you urinate. This is called a water pill (diuretic pill). °· Strictly reducing your salt (sodium) intake. Salt can cause extra fluid to be kept in the body, and this makes ascites worse. °· Having a procedure to remove fluid from your abdomen (paracentesis). °· Having a procedure to transfer fluid from your abdomen into a vein. °· Having a procedure that connects two of the major veins within your liver and relieves pressure on your liver (TIPS procedure). °Ascites may go away or improve with treatment of the condition that caused it.  °HOME CARE INSTRUCTIONS °· Keep track of your weight. To do this, weigh yourself at the same time every day and record your weight. °· Keep track of how much you drink and any changes in the amount you urinate. °· Follow any instructions that your health care provider gives you about how much to drink. °· Try not to eat salty (high-sodium)  foods. °· Take medicines only as directed by your health care provider. °· Keep all follow-up visits as directed by your health care provider. This is important. °· Report any changes in your health to your health care provider, especially if you develop new symptoms or your symptoms get worse. °SEEK MEDICAL CARE IF: °· Your gain more than 3 pounds in 3 days. °· Your abdominal size or your waist size increases. °· You have new swelling in your legs. °· The swelling in your legs gets worse. °SEEK IMMEDIATE MEDICAL CARE IF: °· You develop a fever. °· You develop confusion. °· You develop new or worsening difficulty breathing. °· You develop new or worsening abdominal pain. °· You develop new or worsening swelling in the scrotum (in men). °  °This information is not intended to replace advice given to you by your health care provider. Make sure you discuss any questions you have with your health care provider. °  °Document Released: 04/06/2005 Document Revised: 04/27/2014 Document Reviewed: 11/03/2013 °Elsevier Interactive Patient Education ©2016 [employer] ° °

## 2015-07-13 NOTE — ED Notes (Signed)
Procedure in room in progress

## 2015-07-13 NOTE — ED Notes (Signed)
MD at bedside. Dr. Allen at bedside.  

## 2015-07-13 NOTE — ED Notes (Signed)
Pt was ambulated w plus 3 assistance. Pt was slow but successful.

## 2015-07-13 NOTE — ED Provider Notes (Addendum)
CSN: [id number]     Arrival date & time 07/13/15  1555 History   First MD Initiated Contact with Patient 07/13/15 1702     Chief Complaint  Patient presents with  . Abdominal Pain  . Shortness of Breath     (Consider location/radiation/quality/duration/timing/severity/associated sxs/prior Treatment) HPI Comments: Patient here complaining of increased abdominal distention and dyspnea 24 hours. Has a history of stage IV pancreatic cancer and last received chemotherapy 4 days ago. His last paracentesis was 9 days ago. Denies any fever or chills. Has had increased dyspnea on exertion. Denies any vomiting or diarrhea. Has also noted increased erythema to his lower abdomen as well as bilateral lower extremity edema. Symptoms have been progressively worse and now been associated with anxiety. Use prior to arrival for this  Patient is a 70 y.o. male presenting with abdominal pain and shortness of breath. The history is provided by the patient and the spouse.  Abdominal Pain Associated symptoms: shortness of breath   Shortness of Breath Associated symptoms: abdominal pain     Past Medical History  Diagnosis Date  . Degenerative disc disease   . Atrial fibrillation and flutter ([place])   . Pancreatic cancer ([place])   . Anemia    Past Surgical History  Procedure Laterality Date  . Ablation  08/2005    for A-flutter  . Cervical laminectomy      x3  . Arm wound repair / closure    . Basal cell carcinoma excision    . Portacath placement     Family History  Problem Relation Age of Onset  . Colon cancer Neg Hx   . Lung cancer Mother   . Heart attack Father    Social History  Substance Use Topics  . Smoking status: Never Smoker   . Smokeless tobacco: Never Used  . Alcohol Use: 3.0 - 4.0 oz/week    6-8 Standard drinks or equivalent per week    Review of Systems  Respiratory: Positive for shortness of breath.   Gastrointestinal: Positive for abdominal pain.  All other systems reviewed  and are negative.     Allergies  Review of patient's allergies indicates no known allergies.  Home Medications   Prior to Admission medications   Medication Sig Start Date End Date Taking? Authorizing Provider  aspirin 325 MG tablet Take 325 mg by mouth daily.   Yes Historical Provider, MD  diphenoxylate-atropine (LOMOTIL) 2.5-0.025 MG tablet Take 1 tablet by mouth 4 (four) times daily as needed for diarrhea or loose stools. 07/04/15  Yes Ladell Pier, MD  flecainide (TAMBOCOR) 100 MG tablet Take 1 tablet (100 mg total) by mouth 2 (two) times daily. 05/09/15  Yes Evans Lance, MD  furosemide (LASIX) 20 MG tablet Take 1 tablet (20 mg total) by mouth 2 (two) times daily. 06/18/15  Yes Ladell Pier, MD  HYDROcodone-acetaminophen (NORCO/VICODIN) 5-325 MG tablet Take 1 tablet by mouth every 4 (four) hours as needed. Patient taking differently: Take 1 tablet by mouth every 4 (four) hours as needed for moderate pain.  07/09/15  Yes Ladell Pier, MD  ibuprofen (ADVIL,MOTRIN) 200 MG tablet Take 400 mg by mouth every 6 (six) hours as needed for moderate pain.    Yes Historical Provider, MD  lidocaine-prilocaine (EMLA) cream Place small amount over port area 1-2 hours prior to treatment and cover with plastic wrap.  DO NOT RUB IN 05/02/15  Yes Ladell Pier, MD  lipase/protease/amylase (CREON) 36000 UNITS CPEP capsule Take 2  capsules (72,000 Units total) by mouth 3 (three) times daily before meals. Patient taking differently: Take 108,000 Units by mouth 3 (three) times daily before meals. 07/03/15-Pt to increase to 3 tablets prior to each meal per Dr. Benay Spice. 04/30/15  Yes Ladell Pier, MD  potassium chloride SA (K-DUR,KLOR-CON) 20 MEQ tablet Take 1 tablet (20 mEq total) by mouth 2 (two) times daily. 06/18/15  Yes Ladell Pier, MD  prochlorperazine (COMPAZINE) 10 MG tablet Take 1 tablet (10 mg total) by mouth every 6 (six) hours as needed for nausea or vomiting. 05/02/15  Yes Ladell Pier,  MD  sodium chloride (OCEAN) 0.65 % SOLN nasal spray Place 1 spray into both nostrils daily as needed for congestion.   Yes Historical Provider, MD  metoprolol tartrate (LOPRESSOR) 25 MG tablet Take 1 tablet (25 mg total) by mouth 2 (two) times daily. 05/09/15   Evans Lance, MD  oxyCODONE (OXY IR/ROXICODONE) 5 MG immediate release tablet Take 1-2 tablets (5-10 mg total) by mouth every 4 (four) hours as needed for severe pain. Patient not taking: Reported on 07/13/2015 05/28/15   Owens Shark, NP  Potassium Chloride 40 MEQ/15ML (20%) SOLN Take 7.5 mLs by mouth 2 (two) times daily. Patient not taking: Reported on 07/13/2015 07/05/15   Dorena Cookey, MD   BP 127/69 mmHg  Pulse 122  Temp(Src) 98.8 F (37.1 C) (Oral)  Resp 20  SpO2 100% Physical Exam  Constitutional: He is oriented to person, place, and time. He appears well-developed and well-nourished.  Non-toxic appearance. No distress.  HENT:  Head: Normocephalic and atraumatic.  Eyes: Conjunctivae, EOM and lids are normal. Pupils are equal, round, and reactive to light.  Pale conjunctiva noted  Neck: Normal range of motion. Neck supple. No tracheal deviation present. No thyroid mass present.  Cardiovascular: Regular rhythm and normal heart sounds.  Tachycardia present.  Exam reveals no gallop.   No murmur heard. Pulmonary/Chest: Effort normal and breath sounds normal. No stridor. No respiratory distress. He has no decreased breath sounds. He has no wheezes. He has no rhonchi. He has no rales.  Abdominal: Soft. Normal appearance and bowel sounds are normal. He exhibits distension, fluid wave and ascites. There is no tenderness. There is no rebound and no CVA tenderness.  Erythema extending below the umbilicus across his abdomen. No peritoneal signs noted  Musculoskeletal: Normal range of motion. He exhibits no edema or tenderness.  Lymphadenopathy:  Bilateral upper thigh edema 3+  Neurological: He is alert and oriented to person, place, and  time. He has normal strength. No cranial nerve deficit or sensory deficit. GCS eye subscore is 4. GCS verbal subscore is 5. GCS motor subscore is 6.  Skin: Skin is warm and dry. No abrasion and no rash noted. There is pallor.  Psychiatric: He has a normal mood and affect. His speech is normal and behavior is normal.  Nursing note and vitals reviewed.   ED Course  Procedures (including critical care time) Labs Review Labs Reviewed  CULTURE, BLOOD (ROUTINE X 2)  CULTURE, BLOOD (ROUTINE X 2)  CBC WITH DIFFERENTIAL/PLATELET  COMPREHENSIVE METABOLIC PANEL  I-STAT CG4 LACTIC ACID, ED    Imaging Review No results found. I have personally reviewed and evaluated these images and lab results as part of my medical decision-making.   EKG Interpretation None      MDM   Final diagnoses:  SOB (shortness of breath)    Paracentesis performed by interventional radiologist and 4.9 L of fluid  taken off. Patient's heart rate has improved. Patient states that he normally is hypotensive. He was intubated. The department and denied being dizzy and felt better.Patient's abdominal wall erythema is greatly improved after the paracentesis. Patient's hemoglobin of 8.7 is within the range of this prior studies. Patient offered admission for observation but he feels stable to go home at this time    Lacretia Leigh, MD 07/13/15 [place], MD 07/13/15 [place], MD 07/13/15 [phone]

## 2015-07-13 NOTE — ED Notes (Signed)
Pt is under treatment for pancreatic cancer and ascites.  Pt had 4 L drained x 10 days ago. Was seen 4 days ago and was cleared.  Under chemo treatment.  Pt here with increased abdominal pain and shortness of breath.

## 2015-07-14 DIAGNOSIS — R000 Tachycardia, unspecified: Secondary | ICD-10-CM | POA: Diagnosis not present

## 2015-07-14 DIAGNOSIS — Z7982 Long term (current) use of aspirin: Secondary | ICD-10-CM | POA: Diagnosis not present

## 2015-07-14 DIAGNOSIS — R0602 Shortness of breath: Secondary | ICD-10-CM | POA: Diagnosis not present

## 2015-07-14 DIAGNOSIS — Z8507 Personal history of malignant neoplasm of pancreas: Secondary | ICD-10-CM | POA: Diagnosis not present

## 2015-07-14 DIAGNOSIS — R231 Pallor: Secondary | ICD-10-CM | POA: Diagnosis not present

## 2015-07-14 DIAGNOSIS — R188 Other ascites: Secondary | ICD-10-CM | POA: Diagnosis not present

## 2015-07-14 DIAGNOSIS — Z79899 Other long term (current) drug therapy: Secondary | ICD-10-CM | POA: Diagnosis not present

## 2015-07-14 DIAGNOSIS — Z862 Personal history of diseases of the blood and blood-forming organs and certain disorders involving the immune mechanism: Secondary | ICD-10-CM | POA: Diagnosis not present

## 2015-07-15 MED FILL — PROCHLORPERAZINE 10 MG TAB: 10 | 8 days supply | Qty: 30 | Fill #1

## 2015-07-15 MED FILL — CREON DR 36,000 UNITS CAP: 36000 | 30 days supply | Qty: 180 | Fill #2

## 2015-07-16 ENCOUNTER — Ambulatory Visit: Payer: 59 | Admitting: Oncology

## 2015-07-16 ENCOUNTER — Other Ambulatory Visit: Payer: 59

## 2015-07-16 ENCOUNTER — Telehealth: Payer: Self-pay | Admitting: *Deleted

## 2015-07-16 ENCOUNTER — Ambulatory Visit: Payer: 59

## 2015-07-16 NOTE — Telephone Encounter (Signed)
Message from pt reporting he was seen in ED on 3/25 for paracentesis. Calling to see if he should be seen in office. Returned call to pt, he reported relief after paracentesis. Continues to feel weak and tired. Pt would like to be seen in office. Appt given for 3/30 at 0900.

## 2015-07-18 ENCOUNTER — Other Ambulatory Visit (HOSPITAL_BASED_OUTPATIENT_CLINIC_OR_DEPARTMENT_OTHER): Payer: 59

## 2015-07-18 ENCOUNTER — Other Ambulatory Visit: Payer: Self-pay | Admitting: *Deleted

## 2015-07-18 ENCOUNTER — Ambulatory Visit (HOSPITAL_BASED_OUTPATIENT_CLINIC_OR_DEPARTMENT_OTHER): Payer: 59

## 2015-07-18 ENCOUNTER — Ambulatory Visit (HOSPITAL_COMMUNITY)
Admission: RE | Admit: 2015-07-18 | Discharge: 2015-07-18 | Disposition: A | Discharge status: Home / Self Care | Payer: 59 | Source: Ambulatory Visit | Attending: Oncology | Admitting: Oncology

## 2015-07-18 ENCOUNTER — Ambulatory Visit (HOSPITAL_BASED_OUTPATIENT_CLINIC_OR_DEPARTMENT_OTHER): Payer: 59 | Admitting: Oncology

## 2015-07-18 ENCOUNTER — Ambulatory Visit: Payer: 59

## 2015-07-18 ENCOUNTER — Telehealth: Payer: Self-pay | Admitting: Oncology

## 2015-07-18 VITALS — BP 122/67 | HR 93 | Temp 97.3°F | Resp 18

## 2015-07-18 VITALS — BP 116/55 | HR 106 | Temp 98.2°F | Resp 18 | Ht 71.0 in | Wt 201.9 lb

## 2015-07-18 DIAGNOSIS — C787 Secondary malignant neoplasm of liver and intrahepatic bile duct: Secondary | ICD-10-CM | POA: Diagnosis not present

## 2015-07-18 DIAGNOSIS — C259 Malignant neoplasm of pancreas, unspecified: Secondary | ICD-10-CM

## 2015-07-18 DIAGNOSIS — C250 Malignant neoplasm of head of pancreas: Secondary | ICD-10-CM

## 2015-07-18 DIAGNOSIS — R109 Unspecified abdominal pain: Secondary | ICD-10-CM | POA: Diagnosis not present

## 2015-07-18 DIAGNOSIS — D649 Anemia, unspecified: Secondary | ICD-10-CM | POA: Diagnosis not present

## 2015-07-18 DIAGNOSIS — R600 Localized edema: Secondary | ICD-10-CM

## 2015-07-18 DIAGNOSIS — R188 Other ascites: Secondary | ICD-10-CM

## 2015-07-18 DIAGNOSIS — Z95828 Presence of other vascular implants and grafts: Secondary | ICD-10-CM

## 2015-07-18 DIAGNOSIS — G893 Neoplasm related pain (acute) (chronic): Secondary | ICD-10-CM | POA: Diagnosis not present

## 2015-07-18 DIAGNOSIS — D6481 Anemia due to antineoplastic chemotherapy: Secondary | ICD-10-CM

## 2015-07-18 DIAGNOSIS — K8689 Other specified diseases of pancreas: Secondary | ICD-10-CM

## 2015-07-18 LAB — CBC WITH DIFFERENTIAL/PLATELET
BASO%: 0.2 % (ref 0.0–2.0)
BASOS ABS: 0 10*3/uL (ref 0.0–0.1)
EOS%: 0.7 % (ref 0.0–7.0)
Eosinophils Absolute: 0.1 10*3/uL (ref 0.0–0.5)
HEMATOCRIT: 25.4 % — AB (ref 38.4–49.9)
HEMOGLOBIN: 8.9 g/dL — AB (ref 13.0–17.1)
LYMPH#: 3 10*3/uL (ref 0.9–3.3)
LYMPH%: 30.3 % (ref 14.0–49.0)
MCH: 35.2 pg — ABNORMAL HIGH (ref 27.2–33.4)
MCHC: 35 g/dL (ref 32.0–36.0)
MCV: 100.4 fL — AB (ref 79.3–98.0)
MONO#: 1.3 10*3/uL — AB (ref 0.1–0.9)
MONO%: 12.7 % (ref 0.0–14.0)
NEUT#: 5.5 10*3/uL (ref 1.5–6.5)
NEUT%: 56.1 % (ref 39.0–75.0)
Platelets: 86 10*3/uL — ABNORMAL LOW (ref 140–400)
RBC: 2.53 10*6/uL — AB (ref 4.20–5.82)
RDW: 23.3 % — ABNORMAL HIGH (ref 11.0–14.6)
WBC: 9.9 10*3/uL (ref 4.0–10.3)
nRBC: 1 % — ABNORMAL HIGH (ref 0–0)

## 2015-07-18 LAB — COMPREHENSIVE METABOLIC PANEL
ALT: 13 U/L (ref 0–55)
AST: 32 U/L (ref 5–34)
Albumin: 1.8 g/dL — ABNORMAL LOW (ref 3.5–5.0)
Alkaline Phosphatase: 191 U/L — ABNORMAL HIGH (ref 40–150)
Anion Gap: 6 mEq/L (ref 3–11)
BILIRUBIN TOTAL: 1.64 mg/dL — AB (ref 0.20–1.20)
BUN: 10.4 mg/dL (ref 7.0–26.0)
CHLORIDE: 106 meq/L (ref 98–109)
CO2: 24 meq/L (ref 22–29)
Calcium: 8 mg/dL — ABNORMAL LOW (ref 8.4–10.4)
Creatinine: 0.8 mg/dL (ref 0.7–1.3)
GLUCOSE: 95 mg/dL (ref 70–140)
POTASSIUM: 3.7 meq/L (ref 3.5–5.1)
SODIUM: 136 meq/L (ref 136–145)
TOTAL PROTEIN: 5.5 g/dL — AB (ref 6.4–8.3)

## 2015-07-18 LAB — PREPARE RBC (CROSSMATCH)

## 2015-07-18 MED ORDER — SODIUM CHLORIDE 0.9% FLUSH
10.0000 mL | INTRAVENOUS | Status: AC | PRN
  Administered 2015-07-18: 10 mL
  Filled 2015-07-18: qty 10

## 2015-07-18 MED ORDER — SODIUM CHLORIDE 0.9% FLUSH
10.0000 mL | INTRAVENOUS | Status: DC | PRN
  Administered 2015-07-18: 10 mL via INTRAVENOUS
  Filled 2015-07-18: qty 10

## 2015-07-18 MED ORDER — HYDROCODONE-ACETAMINOPHEN 5-325 MG PO TABS: 1.0000 | ORAL_TABLET | ORAL | Status: DC | PRN

## 2015-07-18 MED ORDER — SODIUM CHLORIDE 0.9 % IV SOLN: 250.0000 mL | Freq: Once | INTRAVENOUS | Status: DC

## 2015-07-18 MED ORDER — HEPARIN SOD (PORK) LOCK FLUSH 100 UNIT/ML IV SOLN
500.0000 [IU] | Freq: Every day | INTRAVENOUS | Status: AC | PRN
  Administered 2015-07-18: 500 [IU]
  Filled 2015-07-18: qty 5

## 2015-07-18 MED ORDER — HEPARIN SOD (PORK) LOCK FLUSH 100 UNIT/ML IV SOLN
500.0000 [IU] | Freq: Once | INTRAVENOUS | Status: AC
  Administered 2015-07-18: 500 [IU] via INTRAVENOUS
  Filled 2015-07-18: qty 5

## 2015-07-18 MED ORDER — OXYCODONE HCL 5 MG PO TABS: 5.0000 mg | ORAL_TABLET | ORAL | Status: AC | PRN

## 2015-07-18 NOTE — Telephone Encounter (Signed)
Gave and printed appt sched and avs for pt for march and April  °

## 2015-07-18 NOTE — Patient Instructions (Signed)

## 2015-07-18 NOTE — Patient Instructions (Signed)

## 2015-07-18 NOTE — Progress Notes (Signed)
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 He was seen in the emergency room 07/13/2015 with increased abdominal distention. He underwent a therapeutic paracentesis and 4.9 L of fluid were removed.  He reports feeling better after the paracentesis, but he has noted increased malaise since the last treatment with chemotherapy. He felt better after a red cell transfusion earlier this month.  The leg swelling is partially improved. He takes hydrocodone for pain several times per day. Occasionally takes oxycodone. Diarrhea is controlled with Creon and Lomotil.  Objective:  Vital signs in last 24 hours:  Blood pressure 116/55, pulse 106, temperature 98.2 F (36.8 C), temperature source Oral, resp. rate 18, height 5\' 11"  (1.803 m), weight 201 lb 14.4 oz (91.581 kg), SpO2 98 %.    HEENT: No thrush or ulcers Resp: Lungs clear bilaterally Cardio: Regular rate and rhythm GI: No hepatomegaly, distended Vascular: 1+ edema at the legs bilaterally, edema at the low abdominal wall Neuro: Alert and oriented     Portacath/PICC-without erythema  Lab Results:  Lab Results  Component Value Date   WBC 9.9 07/18/2015   HGB 8.9* 07/18/2015   HCT 25.4* 07/18/2015   MCV 100.4* 07/18/2015   PLT 86* 07/18/2015   NEUTROABS 5.5 07/18/2015  07/02/2015: CA 19-9- 27,211  Medications: I have reviewed the patient's current medications.  Assessment/Plan: 1. Pancreas cancer, stage IV  CT abdomen/pelvis 04/29/2015 revealed a pancreas uncinate mass, liver metastases, lung nodules, extensive retroperitoneal/mesenteric, pelvic, and low mediastinal lymphadenopathy  Ultrasound-guided biopsy of a right liver lesion 05/02/2015 confirmed metastatic adenocarcinoma  Cycle 1 gemcitabine/Abraxane 05/07/2015  Cycle 2 gemcitabine/Abraxane 05/21/2015 (50% dose reduction)  Cycle 3 gemcitabine/Abraxane  06/05/2015  Cycle 4 gemcitabine/Abraxane 06/18/2015  Cycle 5 gemcitabine/Abraxane 07/09/2015  2. Anorexia/weight loss  3. Abdominal/back pain secondary to the pancreas mass and liver metastases-improved  4. History of atrial fibrillation and flutter  5. [place] likely secondary to pancreatic insufficiency, he will increase the pancreatic enzyme replacement and try Lomotil  6. Oral candidiasis 05/03/2015-treated with Diflucan; recurrent oral candidiasis 05/28/2015. Treated with Diflucan.  7. Hyperbilirubinemia.   CT abdomen/pelvis 05/14/2015 with no biliary duct dilatation. Mild lateral segment left liver lobe intrahepatic duct dilatation similar and likely secondary to a central left liver lobe metastasis. Decreased size of pancreatic uncinate process primary. Mild improvement in hepatic metastasis. Similar abdominal nodal metastasis. Porta hepatis node larger. Likely smimlar pulmonary metastasis. Increase in small volume of abdominal ascites.  Labs improved 05/21/2015; further improved 05/28/2015, 06/05/2015 and 06/18/2015  8. Hypotension. Metoprolol adjusted to 12.5 mg twice daily 05/21/2015. Metoprolol discontinued 06/25/2015.  9. ascites-status post a therapeutic paracentesis 07/02/2015- cytology revealed reactive mesothelial cells, repeat paracentesis 07/13/2015  10.   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 His overall performance status is partially improved and the CA 19-9 is lower. He continues to have symptoms related to ascites and anemia. He will be transfused with packed red blood cells today. We will schedule a therapeutic paracentesis for 07/19/2015. The ascites may be related to hypoalbuminemia versus malignant ascites. We will repeat the peritoneal fluid cytology on the specimen  from 07/19/2015.  Dr. Redmond Pulling will continue hydrocodone and oxycodone as needed for pain. He will return for an office visit and scheduled chemotherapy 07/23/2015. The plan is to schedule a restaging CT  after the next cycle of chemotherapy.  Betsy Coder, MD  07/18/2015  10:10 AM

## 2015-07-18 NOTE — Progress Notes (Signed)
No need for pt to take pre meds with PRBC's per Dr. Benay Spice.

## 2015-07-19 ENCOUNTER — Ambulatory Visit (HOSPITAL_COMMUNITY)
Admission: RE | Admit: 2015-07-19 | Discharge: 2015-07-19 | Disposition: A | Discharge status: Home / Self Care | Payer: 59 | Source: Ambulatory Visit | Attending: Oncology | Admitting: Oncology

## 2015-07-19 ENCOUNTER — Telehealth: Payer: Self-pay | Admitting: *Deleted

## 2015-07-19 DIAGNOSIS — C250 Malignant neoplasm of head of pancreas: Secondary | ICD-10-CM

## 2015-07-19 DIAGNOSIS — R188 Other ascites: Secondary | ICD-10-CM | POA: Diagnosis not present

## 2015-07-19 LAB — CULTURE, BLOOD (ROUTINE X 2)
CULTURE: NO GROWTH
Culture: NO GROWTH

## 2015-07-19 LAB — TYPE AND SCREEN
ABO/RH(D): O POS
Antibody Screen: NEGATIVE
UNIT DIVISION: 0
UNIT DIVISION: 0

## 2015-07-19 LAB — CANCER ANTIGEN 19-9

## 2015-07-19 MED ORDER — LIDOCAINE HCL (PF) 1 % IJ SOLN
INTRAMUSCULAR | Status: AC
  Filled 2015-07-19: qty 10

## 2015-07-19 MED FILL — HYDROCODON-APAP 5-325: 5-325 | 12 days supply | Qty: 75 | Fill #0

## 2015-07-19 MED FILL — oxyCODONE HCL 5 MG TABS: 5 | 6 days supply | Qty: 75 | Fill #0

## 2015-07-19 NOTE — Telephone Encounter (Signed)
Pt notified that ca19-9 is better per Dr. Benay Spice.  Prior ca19-9 results reviewed with pt.  Pt appreciative of phone call and has no questions or concerns at this time.

## 2015-07-19 NOTE — Telephone Encounter (Signed)
-----   Message from Ladell Pier, MD sent at 07/19/2015  4:19 PM EDT ----- Please call patient, ca19-9 is better

## 2015-07-19 NOTE — Procedures (Signed)
Successful US guided paracentesis from RLQ.  Yielded 4.8L of cloudy yellow fluid.  No immediate complications.  Pt tolerated well.   Specimen was sent for labs.  Ascencion Dike PA-C 07/19/2015 11:06 AM

## 2015-07-23 ENCOUNTER — Ambulatory Visit (HOSPITAL_BASED_OUTPATIENT_CLINIC_OR_DEPARTMENT_OTHER): Payer: 59

## 2015-07-23 ENCOUNTER — Other Ambulatory Visit (HOSPITAL_BASED_OUTPATIENT_CLINIC_OR_DEPARTMENT_OTHER): Payer: 59

## 2015-07-23 ENCOUNTER — Ambulatory Visit (HOSPITAL_BASED_OUTPATIENT_CLINIC_OR_DEPARTMENT_OTHER): Payer: 59 | Admitting: Nurse Practitioner

## 2015-07-23 ENCOUNTER — Telehealth: Payer: Self-pay | Admitting: Oncology

## 2015-07-23 ENCOUNTER — Ambulatory Visit: Payer: 59

## 2015-07-23 ENCOUNTER — Ambulatory Visit: Payer: 59 | Admitting: Nutrition

## 2015-07-23 VITALS — BP 122/68 | HR 86 | Temp 98.3°F | Resp 18 | Ht 71.0 in | Wt 194.2 lb

## 2015-07-23 DIAGNOSIS — D6481 Anemia due to antineoplastic chemotherapy: Secondary | ICD-10-CM

## 2015-07-23 DIAGNOSIS — C787 Secondary malignant neoplasm of liver and intrahepatic bile duct: Secondary | ICD-10-CM | POA: Diagnosis not present

## 2015-07-23 DIAGNOSIS — C250 Malignant neoplasm of head of pancreas: Secondary | ICD-10-CM

## 2015-07-23 DIAGNOSIS — G893 Neoplasm related pain (acute) (chronic): Secondary | ICD-10-CM

## 2015-07-23 DIAGNOSIS — Z95828 Presence of other vascular implants and grafts: Secondary | ICD-10-CM

## 2015-07-23 DIAGNOSIS — R109 Unspecified abdominal pain: Secondary | ICD-10-CM

## 2015-07-23 DIAGNOSIS — Z5111 Encounter for antineoplastic chemotherapy: Secondary | ICD-10-CM

## 2015-07-23 DIAGNOSIS — M549 Dorsalgia, unspecified: Secondary | ICD-10-CM

## 2015-07-23 LAB — COMPREHENSIVE METABOLIC PANEL
ALT: 14 U/L (ref 0–55)
AST: 36 U/L — ABNORMAL HIGH (ref 5–34)
Albumin: 1.9 g/dL — ABNORMAL LOW (ref 3.5–5.0)
Alkaline Phosphatase: 210 U/L — ABNORMAL HIGH (ref 40–150)
Anion Gap: 6 mEq/L (ref 3–11)
BUN: 15.3 mg/dL (ref 7.0–26.0)
CHLORIDE: 108 meq/L (ref 98–109)
CO2: 23 meq/L (ref 22–29)
Calcium: 8 mg/dL — ABNORMAL LOW (ref 8.4–10.4)
Creatinine: 0.8 mg/dL (ref 0.7–1.3)
EGFR: >90 mL/min/{1.73_m2} (ref >90)
Glucose: 93 mg/dl (ref 70–140)
Potassium: 3.8 mEq/L (ref 3.5–5.1)
SODIUM: 137 meq/L (ref 136–145)
TOTAL PROTEIN: 5.7 g/dL — AB (ref 6.4–8.3)
Total Bilirubin: 2.35 mg/dL — ABNORMAL HIGH (ref 0.20–1.20)

## 2015-07-23 LAB — CBC WITH DIFFERENTIAL/PLATELET
BASO%: 0.3 % (ref 0.0–2.0)
Basophils Absolute: 0 10*3/uL (ref 0.0–0.1)
EOS%: 0.9 % (ref 0.0–7.0)
Eosinophils Absolute: 0.1 10*3/uL (ref 0.0–0.5)
HCT: 33.5 % — ABNORMAL LOW (ref 38.4–49.9)
HGB: 11.4 g/dL — ABNORMAL LOW (ref 13.0–17.1)
LYMPH%: 24 % (ref 14.0–49.0)
MCH: 33.6 pg — ABNORMAL HIGH (ref 27.2–33.4)
MCHC: 34 g/dL (ref 32.0–36.0)
MCV: 98.8 fL — ABNORMAL HIGH (ref 79.3–98.0)
MONO#: 1.4 10*3/uL — AB (ref 0.1–0.9)
MONO%: 19.1 % — ABNORMAL HIGH (ref 0.0–14.0)
NEUT%: 55.7 % (ref 39.0–75.0)
NEUTROS ABS: 4.1 10*3/uL (ref 1.5–6.5)
PLATELETS: 217 10*3/uL (ref 140–400)
RBC: 3.39 10*6/uL — AB (ref 4.20–5.82)
RDW: 23.3 % — ABNORMAL HIGH (ref 11.0–14.6)
WBC: 7.4 10*3/uL (ref 4.0–10.3)
lymph#: 1.8 10*3/uL (ref 0.9–3.3)

## 2015-07-23 MED ORDER — SODIUM CHLORIDE 0.9% FLUSH
10.0000 mL | INTRAVENOUS | Status: DC | PRN
  Administered 2015-07-23: 10 mL via INTRAVENOUS
  Filled 2015-07-23: qty 10

## 2015-07-23 MED ORDER — SODIUM CHLORIDE 0.9 % IJ SOLN
10.0000 mL | INTRAMUSCULAR | Status: DC | PRN
  Administered 2015-07-23: 10 mL
  Filled 2015-07-23: qty 10

## 2015-07-23 MED ORDER — SODIUM CHLORIDE 0.9 % IV SOLN
400.0000 mg/m2 | Freq: Once | INTRAVENOUS | Status: AC
  Administered 2015-07-23: 836 mg via INTRAVENOUS
  Filled 2015-07-23: qty 21.99

## 2015-07-23 MED ORDER — PACLITAXEL PROTEIN-BOUND CHEMO INJECTION 100 MG
50.0000 mg/m2 | Freq: Once | INTRAVENOUS | Status: AC
  Administered 2015-07-23: 100 mg via INTRAVENOUS
  Filled 2015-07-23: qty 20

## 2015-07-23 MED ORDER — PALONOSETRON HCL INJECTION 0.25 MG/5ML
0.2500 mg | Freq: Once | INTRAVENOUS | Status: AC
  Administered 2015-07-23: 0.25 mg via INTRAVENOUS

## 2015-07-23 MED ORDER — SODIUM CHLORIDE 0.9 % IV SOLN
10.0000 mg | Freq: Once | INTRAVENOUS | Status: AC
  Administered 2015-07-23: 10 mg via INTRAVENOUS
  Filled 2015-07-23: qty 1

## 2015-07-23 MED ORDER — SODIUM CHLORIDE 0.9 % IV SOLN
Freq: Once | INTRAVENOUS | Status: AC
  Administered 2015-07-23: 12:00:00 via INTRAVENOUS

## 2015-07-23 MED ORDER — PALONOSETRON HCL INJECTION 0.25 MG/5ML
INTRAVENOUS | Status: AC
  Filled 2015-07-23: qty 5

## 2015-07-23 MED ORDER — HEPARIN SOD (PORK) LOCK FLUSH 100 UNIT/ML IV SOLN
500.0000 [IU] | Freq: Once | INTRAVENOUS | Status: AC | PRN
  Administered 2015-07-23: 500 [IU]
  Filled 2015-07-23: qty 5

## 2015-07-23 NOTE — Progress Notes (Addendum)
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 He completed cycle 5 gemcitabine/Abraxane 07/09/2015. He was transfused 2 units of blood on 07/18/2015. He had a paracentesis on 07/19/2015 with 4.8 L of fluid removed. He notes that he felt better following the blood transfusion and paracentesis. He feels "weaker" after each chemotherapy. No nausea or vomiting. No mouth sores. He has loose stools 1-3 times a day. He is taking pancreatic enzyme replacement and Lomotil. Appetite is poor.  Objective:  Vital signs in last 24 hours:  Blood pressure 122/68, pulse 86, temperature 98.3 F (36.8 C), temperature source Oral, resp. rate 18, height 5\' 11"  (1.803 m), weight 194 lb 3.2 oz (88.089 kg), SpO2 100 %.    HEENT: No thrush or ulcers. Resp: Lungs clear bilaterally. Cardio: Regular rate and rhythm. GI: Abdomen is distended. Ascites. Vascular: Pitting edema throughout both legs. Edema at the low abdominal wall and sacrum. Neuro: Alert and oriented.  Port-A-Cath without erythema.   Lab Results:  Lab Results  Component Value Date   WBC 7.4 07/23/2015   HGB 11.4* 07/23/2015   HCT 33.5* 07/23/2015   MCV 98.8* 07/23/2015   PLT 217 07/23/2015   NEUTROABS 4.1 07/23/2015     Medications: I have reviewed the patient's current medications.  Assessment/Plan: 1. Pancreas cancer, stage IV  CT abdomen/pelvis 04/29/2015 revealed a pancreas uncinate mass, liver metastases, lung nodules, extensive retroperitoneal/mesenteric, pelvic, and low mediastinal lymphadenopathy  Ultrasound-guided biopsy of a right liver lesion 05/02/2015 confirmed metastatic adenocarcinoma  Cycle 1 gemcitabine/Abraxane 05/07/2015  Cycle 2 gemcitabine/Abraxane 05/21/2015 (50% dose reduction)  Cycle 3 gemcitabine/Abraxane 06/05/2015  Cycle 4 gemcitabine/Abraxane 06/18/2015  Cycle 5 gemcitabine/Abraxane 07/09/2015  Cycle 6  gemcitabine/Abraxane 07/23/2015  2. Anorexia/weight loss  3. Abdominal/back pain secondary to the pancreas mass and liver metastases-improved  4. History of atrial fibrillation and flutter  5. [place] likely secondary to pancreatic insufficiency, he will increase the pancreatic enzyme replacement and try Lomotil  6. Oral candidiasis 05/03/2015-treated with Diflucan; recurrent oral candidiasis 05/28/2015. Treated with Diflucan.  7. Hyperbilirubinemia.   CT abdomen/pelvis 05/14/2015 with no biliary duct dilatation. Mild lateral segment left liver lobe intrahepatic duct dilatation similar and likely secondary to a central left liver lobe metastasis. Decreased size of pancreatic uncinate process primary. Mild improvement in hepatic metastasis. Similar abdominal nodal metastasis. Porta hepatis node larger. Likely smimlar pulmonary metastasis. Increase in small volume of abdominal ascites.  Labs improved 05/21/2015; further improved 05/28/2015, 06/05/2015 and 06/18/2015  8. Hypotension. Metoprolol adjusted to 12.5 mg twice daily 05/21/2015. Metoprolol discontinued 06/25/2015.  9. Ascites-status post a therapeutic paracentesis 07/02/2015- cytology revealed reactive mesothelial cells, repeat paracentesis 07/13/2015, 07/19/2015  10. 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 He has completed 5 cycles of gemcitabine/Abraxane. Plan to proceed with cycle 6 today as scheduled. Restaging CT scans on 08/08/2015.  We are referring him for a therapeutic paracentesis on 07/25/2015 and 08/01/2015.  He will return for a follow-up visit and possible chemotherapy on 08/13/2015. He will contact the office in the interim with any problems.  Patient seen with Dr. Benay Spice. 25 minutes were spent face-to-face at today's visit with the majority of that time involved  in counseling/coordination of care.    Ned Card ANP/GNP-BC   07/23/2015  10:42 AM   this was a shared visit with Ned Card. Dr. Redmond Pulling has  An improved performance status after the red cell transfusion last week. He will complete another treatment with gemcitabine/Abraxane today. He will then undergo a restaging CT value weight.   the CA 19-9 is improved.   Julieanne Manson, M.D.

## 2015-07-23 NOTE — Progress Notes (Signed)
OK to treat with today's bilirubin per Leander Rams NP

## 2015-07-23 NOTE — Progress Notes (Signed)
Nutrition follow-up completed with patient during infusion for cancer of the pancreas. Weight decreased and documented as 194.2 pounds on April 4. Patient has edema in both legs and lower abdominal and sacrum swelling. Patient had 4.8 L fluid removed during paracentesis March 31. Patient is scheduled for additional paracentesis April 6 and April 13. Patient continues to struggle to increase oral intake. Reports he is going to begin taking increased enzymes.  Nutrition diagnosis: Unintended weight loss continues.  Intervention:  Reviewed importance of taking enzymes with any food or beverage intake. Encouraged patient to continue to focus on high-calorie, high-protein foods as tolerated. Teach back method used.  Monitoring, evaluation, goals: Patient will work to tolerate increased calories and protein to improve quality-of-life and maintain lean body mass.  Next visit: I will continue to work with patient as needed.  **Disclaimer: This note was dictated with voice recognition software. Similar sounding words can inadvertently be transcribed and this note may contain transcription errors which may not have been corrected upon publication of note.**

## 2015-07-23 NOTE — Patient Instructions (Signed)

## 2015-07-23 NOTE — Patient Instructions (Signed)
[facility] Discharge Instructions for Patients Receiving Chemotherapy  Today you received the following chemotherapy agents: Abraxane and Gemzar   To help prevent nausea and vomiting after your treatment, we encourage you to take your nausea medication as directed.    If you develop nausea and vomiting that is not controlled by your nausea medication, call the clinic.   BELOW ARE SYMPTOMS THAT SHOULD BE REPORTED IMMEDIATELY:  *FEVER GREATER THAN 100.5 F  *CHILLS WITH OR WITHOUT FEVER  NAUSEA AND VOMITING THAT IS NOT CONTROLLED WITH YOUR NAUSEA MEDICATION  *UNUSUAL SHORTNESS OF BREATH  *UNUSUAL BRUISING OR BLEEDING  TENDERNESS IN MOUTH AND THROAT WITH OR WITHOUT PRESENCE OF ULCERS  *URINARY PROBLEMS  *BOWEL PROBLEMS  UNUSUAL RASH Items with * indicate a potential emergency and should be followed up as soon as possible.  Feel free to call the clinic you have any questions or concerns. The clinic phone number is [phone].  Please show the CHEMO ALERT CARD at check-in to the Emergency Department and triage nurse.   

## 2015-07-23 NOTE — Telephone Encounter (Signed)
Gave adn printed appt shced and avs fo rpt for April ...gv barium

## 2015-07-24 ENCOUNTER — Telehealth: Payer: Self-pay | Admitting: *Deleted

## 2015-07-24 LAB — CANCER ANTIGEN 19-9

## 2015-07-24 NOTE — Telephone Encounter (Signed)
Message from pt stating he has not been able to get in touch with radiology to schedule paracentesis. Called radiology, appts scheduled. Called pt with appointments and instructions. He understands to be NPO 4 hours prior to CT.

## 2015-07-25 ENCOUNTER — Encounter: Payer: Self-pay | Admitting: Oncology

## 2015-07-25 ENCOUNTER — Ambulatory Visit (HOSPITAL_COMMUNITY)
Admission: RE | Admit: 2015-07-25 | Discharge: 2015-07-25 | Disposition: A | Discharge status: Home / Self Care | Payer: 59 | Source: Ambulatory Visit | Attending: Nurse Practitioner | Admitting: Nurse Practitioner

## 2015-07-25 DIAGNOSIS — R188 Other ascites: Secondary | ICD-10-CM | POA: Diagnosis not present

## 2015-07-25 DIAGNOSIS — C250 Malignant neoplasm of head of pancreas: Secondary | ICD-10-CM | POA: Diagnosis not present

## 2015-07-25 NOTE — Procedures (Signed)
Ultrasound-guided diagnostic and therapeutic paracentesis performed yielding 4.5 liters of turbid, yellow  fluid. No immediate complications. A portion of the fluid was sent to the lab for cytology.

## 2015-07-25 NOTE — Progress Notes (Signed)
Faxes sent 06/28/15 and 06/19/15 sent to medical records

## 2015-07-25 NOTE — Progress Notes (Signed)
Faxes sent 06/06/15 -2 were sent to medical records

## 2015-08-01 ENCOUNTER — Other Ambulatory Visit: Payer: Self-pay | Admitting: *Deleted

## 2015-08-01 ENCOUNTER — Telehealth: Payer: Self-pay | Admitting: Oncology

## 2015-08-01 ENCOUNTER — Ambulatory Visit (HOSPITAL_COMMUNITY)
Admission: RE | Admit: 2015-08-01 | Discharge: 2015-08-01 | Disposition: A | Discharge status: Home / Self Care | Payer: 59 | Source: Ambulatory Visit | Attending: Nurse Practitioner | Admitting: Nurse Practitioner

## 2015-08-01 DIAGNOSIS — C250 Malignant neoplasm of head of pancreas: Secondary | ICD-10-CM | POA: Insufficient documentation

## 2015-08-01 DIAGNOSIS — R188 Other ascites: Secondary | ICD-10-CM | POA: Diagnosis not present

## 2015-08-01 NOTE — Telephone Encounter (Signed)
per pof to sch pt appt-per Lorriane Shire she willc all pt tomorrow 4/14 and give appt times

## 2015-08-01 NOTE — Telephone Encounter (Signed)
[facility] will call to sch Parasenthesis

## 2015-08-01 NOTE — Procedures (Signed)
Ultrasound-guided diagnostic and therapeutic paracentesis performed yielding 5 liters of yellow colored fluid. No immediate complications.  Derrius Furtick E 11:48 AM 08/01/2015

## 2015-08-02 ENCOUNTER — Other Ambulatory Visit: Payer: Self-pay | Admitting: Oncology

## 2015-08-02 MED FILL — DIPHENOXYLATE-ATROPINE TAB: 2.5-0.025 | 8 days supply | Qty: 60 | Fill #0

## 2015-08-02 NOTE — Telephone Encounter (Signed)
Called pt to review his schedule with him for next week.  Pt very appreciative of call.  Pt requests refill on Lomotil.  Will call in now for pt.

## 2015-08-04 ENCOUNTER — Other Ambulatory Visit: Payer: Self-pay | Admitting: Oncology

## 2015-08-07 ENCOUNTER — Ambulatory Visit (HOSPITAL_COMMUNITY)
Admission: RE | Admit: 2015-08-07 | Discharge: 2015-08-07 | Disposition: A | Discharge status: Home / Self Care | Payer: 59 | Source: Ambulatory Visit | Attending: Oncology | Admitting: Oncology

## 2015-08-07 DIAGNOSIS — C250 Malignant neoplasm of head of pancreas: Secondary | ICD-10-CM | POA: Insufficient documentation

## 2015-08-07 DIAGNOSIS — R188 Other ascites: Secondary | ICD-10-CM | POA: Diagnosis not present

## 2015-08-07 NOTE — Procedures (Signed)
Ultrasound-guided therapeutic paracentesis performed yielding 5.3 liters of turbid, yellow fluid. No immediate complications.

## 2015-08-08 ENCOUNTER — Ambulatory Visit (HOSPITAL_BASED_OUTPATIENT_CLINIC_OR_DEPARTMENT_OTHER): Payer: 59

## 2015-08-08 ENCOUNTER — Ambulatory Visit (HOSPITAL_COMMUNITY)
Admission: RE | Admit: 2015-08-08 | Discharge: 2015-08-08 | Disposition: A | Discharge status: Home / Self Care | Payer: 59 | Source: Ambulatory Visit | Attending: Nurse Practitioner | Admitting: Nurse Practitioner

## 2015-08-08 ENCOUNTER — Other Ambulatory Visit: Payer: Self-pay

## 2015-08-08 ENCOUNTER — Telehealth: Payer: Self-pay | Admitting: *Deleted

## 2015-08-08 ENCOUNTER — Encounter (HOSPITAL_COMMUNITY): Payer: Self-pay

## 2015-08-08 DIAGNOSIS — Z452 Encounter for adjustment and management of vascular access device: Secondary | ICD-10-CM | POA: Diagnosis not present

## 2015-08-08 DIAGNOSIS — C250 Malignant neoplasm of head of pancreas: Secondary | ICD-10-CM | POA: Insufficient documentation

## 2015-08-08 DIAGNOSIS — Z95828 Presence of other vascular implants and grafts: Secondary | ICD-10-CM | POA: Insufficient documentation

## 2015-08-08 DIAGNOSIS — K769 Liver disease, unspecified: Secondary | ICD-10-CM | POA: Insufficient documentation

## 2015-08-08 DIAGNOSIS — R188 Other ascites: Secondary | ICD-10-CM | POA: Diagnosis not present

## 2015-08-08 DIAGNOSIS — C259 Malignant neoplasm of pancreas, unspecified: Secondary | ICD-10-CM | POA: Diagnosis not present

## 2015-08-08 DIAGNOSIS — R918 Other nonspecific abnormal finding of lung field: Secondary | ICD-10-CM | POA: Insufficient documentation

## 2015-08-08 MED ORDER — SODIUM CHLORIDE 0.9 % IJ SOLN
10.0000 mL | INTRAMUSCULAR | Status: DC | PRN
  Administered 2015-08-08: 10 mL via INTRAVENOUS
  Filled 2015-08-08: qty 10

## 2015-08-08 MED ORDER — IOPAMIDOL (ISOVUE-300) INJECTION 61%
100.0000 mL | Freq: Once | INTRAVENOUS | Status: AC | PRN
  Administered 2015-08-08: 100 mL via INTRAVENOUS

## 2015-08-08 MED ORDER — DIATRIZOATE MEGLUMINE & SODIUM 66-10 % PO SOLN
30.0000 mL | Freq: Once | ORAL | Status: AC
  Administered 2015-08-08: 30 mL via ORAL

## 2015-08-08 NOTE — Telephone Encounter (Signed)
-----   Message from Ladell Pier, MD sent at 08/08/2015  5:29 PM EDT ----- Please call patient, CT looks better- liver lesions, pancreas mass, and abdominal nodes are smaller

## 2015-08-08 NOTE — Telephone Encounter (Signed)
Called pt with CT result, per Dr. Gearldine Shown note below. He voiced understanding, will follow up 4/25 as scheduled.

## 2015-08-08 NOTE — Patient Instructions (Signed)

## 2015-08-09 ENCOUNTER — Telehealth: Payer: Self-pay | Admitting: *Deleted

## 2015-08-09 DIAGNOSIS — C250 Malignant neoplasm of head of pancreas: Secondary | ICD-10-CM

## 2015-08-09 NOTE — Telephone Encounter (Signed)
Message from pt requesting to be scheduled for paracentesis next Wed or Thurs. Last done on 4/19, went well. Reviewed with Ned Card, NP: OK to set pt up for paracentesis. Informed pt radiology schedulers will contact him to schedule.

## 2015-08-13 ENCOUNTER — Telehealth: Payer: Self-pay | Admitting: *Deleted

## 2015-08-13 ENCOUNTER — Ambulatory Visit (HOSPITAL_BASED_OUTPATIENT_CLINIC_OR_DEPARTMENT_OTHER): Payer: 59

## 2015-08-13 ENCOUNTER — Encounter: Payer: Self-pay | Admitting: *Deleted

## 2015-08-13 ENCOUNTER — Ambulatory Visit (HOSPITAL_BASED_OUTPATIENT_CLINIC_OR_DEPARTMENT_OTHER): Payer: 59 | Admitting: Oncology

## 2015-08-13 ENCOUNTER — Other Ambulatory Visit (HOSPITAL_BASED_OUTPATIENT_CLINIC_OR_DEPARTMENT_OTHER): Payer: 59

## 2015-08-13 ENCOUNTER — Other Ambulatory Visit: Payer: Self-pay | Admitting: *Deleted

## 2015-08-13 ENCOUNTER — Ambulatory Visit: Payer: 59

## 2015-08-13 ENCOUNTER — Other Ambulatory Visit: Payer: 59

## 2015-08-13 VITALS — BP 93/63 | HR 101 | Temp 98.3°F | Resp 18 | Ht 71.0 in | Wt 177.3 lb

## 2015-08-13 DIAGNOSIS — E8809 Other disorders of plasma-protein metabolism, not elsewhere classified: Secondary | ICD-10-CM | POA: Diagnosis not present

## 2015-08-13 DIAGNOSIS — D6481 Anemia due to antineoplastic chemotherapy: Secondary | ICD-10-CM

## 2015-08-13 DIAGNOSIS — R188 Other ascites: Secondary | ICD-10-CM | POA: Diagnosis not present

## 2015-08-13 DIAGNOSIS — C250 Malignant neoplasm of head of pancreas: Secondary | ICD-10-CM

## 2015-08-13 DIAGNOSIS — C787 Secondary malignant neoplasm of liver and intrahepatic bile duct: Secondary | ICD-10-CM

## 2015-08-13 DIAGNOSIS — Z5111 Encounter for antineoplastic chemotherapy: Secondary | ICD-10-CM | POA: Diagnosis not present

## 2015-08-13 DIAGNOSIS — D630 Anemia in neoplastic disease: Secondary | ICD-10-CM

## 2015-08-13 DIAGNOSIS — K8689 Other specified diseases of pancreas: Secondary | ICD-10-CM

## 2015-08-13 DIAGNOSIS — Z95828 Presence of other vascular implants and grafts: Secondary | ICD-10-CM

## 2015-08-13 LAB — COMPREHENSIVE METABOLIC PANEL
ALT: 14 U/L (ref 0–55)
ANION GAP: 6 meq/L (ref 3–11)
AST: 36 U/L — ABNORMAL HIGH (ref 5–34)
Albumin: 1.8 g/dL — ABNORMAL LOW (ref 3.5–5.0)
Alkaline Phosphatase: 182 U/L — ABNORMAL HIGH (ref 40–150)
BILIRUBIN TOTAL: 1.12 mg/dL (ref 0.20–1.20)
BUN: 23.2 mg/dL (ref 7.0–26.0)
CALCIUM: 8.2 mg/dL — AB (ref 8.4–10.4)
CHLORIDE: 104 meq/L (ref 98–109)
CO2: 20 mEq/L — ABNORMAL LOW (ref 22–29)
CREATININE: 1.1 mg/dL (ref 0.7–1.3)
EGFR: 69 mL/min/{1.73_m2} — ABNORMAL LOW (ref >90)
Glucose: 95 mg/dl (ref 70–140)
Potassium: 4.4 mEq/L (ref 3.5–5.1)
Sodium: 130 mEq/L — ABNORMAL LOW (ref 136–145)
Total Protein: 5.5 g/dL — ABNORMAL LOW (ref 6.4–8.3)

## 2015-08-13 LAB — CBC WITH DIFFERENTIAL/PLATELET
BASO%: 0.6 % (ref 0.0–2.0)
BASOS ABS: 0.1 10*3/uL (ref 0.0–0.1)
EOS ABS: 0.1 10*3/uL (ref 0.0–0.5)
EOS%: 0.5 % (ref 0.0–7.0)
HEMATOCRIT: 32.5 % — AB (ref 38.4–49.9)
HGB: 11.6 g/dL — ABNORMAL LOW (ref 13.0–17.1)
LYMPH%: 21.4 % (ref 14.0–49.0)
MCH: 35.9 pg — ABNORMAL HIGH (ref 27.2–33.4)
MCHC: 35.7 g/dL (ref 32.0–36.0)
MCV: 100.6 fL — AB (ref 79.3–98.0)
MONO#: 2.7 10*3/uL — AB (ref 0.1–0.9)
MONO%: 14.1 % — ABNORMAL HIGH (ref 0.0–14.0)
NEUT%: 63.4 % (ref 39.0–75.0)
NEUTROS ABS: 12.2 10*3/uL — AB (ref 1.5–6.5)
PLATELETS: 318 10*3/uL (ref 140–400)
RBC: 3.23 10*6/uL — ABNORMAL LOW (ref 4.20–5.82)
RDW: 26.2 % — ABNORMAL HIGH (ref 11.0–14.6)
WBC: 19.3 10*3/uL — AB (ref 4.0–10.3)
lymph#: 4.1 10*3/uL — ABNORMAL HIGH (ref 0.9–3.3)
nRBC: 0 % (ref 0–0)

## 2015-08-13 LAB — TECHNOLOGIST REVIEW

## 2015-08-13 MED ORDER — SODIUM CHLORIDE 0.9 % IJ SOLN
10.0000 mL | INTRAMUSCULAR | Status: DC | PRN
  Administered 2015-08-13: 10 mL
  Filled 2015-08-13: qty 10

## 2015-08-13 MED ORDER — SODIUM CHLORIDE 0.9 % IJ SOLN
10.0000 mL | INTRAMUSCULAR | Status: DC | PRN
  Administered 2015-08-13: 10 mL via INTRAVENOUS
  Filled 2015-08-13: qty 10

## 2015-08-13 MED ORDER — SPIRONOLACTONE 50 MG PO TABS: 50.0000 mg | ORAL_TABLET | Freq: Every day | ORAL | Status: DC

## 2015-08-13 MED ORDER — HYDROCODONE-ACETAMINOPHEN 5-325 MG PO TABS: 1.0000 | ORAL_TABLET | ORAL | Status: DC | PRN

## 2015-08-13 MED ORDER — HEPARIN SOD (PORK) LOCK FLUSH 100 UNIT/ML IV SOLN
500.0000 [IU] | Freq: Once | INTRAVENOUS | Status: AC | PRN
  Administered 2015-08-13: 500 [IU]
  Filled 2015-08-13: qty 5

## 2015-08-13 MED ORDER — SODIUM CHLORIDE 0.9 % IV SOLN
10.0000 mg | Freq: Once | INTRAVENOUS | Status: AC
  Administered 2015-08-13: 10 mg via INTRAVENOUS
  Filled 2015-08-13: qty 1

## 2015-08-13 MED ORDER — PACLITAXEL PROTEIN-BOUND CHEMO INJECTION 100 MG
50.0000 mg/m2 | Freq: Once | INTRAVENOUS | Status: AC
  Administered 2015-08-13: 100 mg via INTRAVENOUS
  Filled 2015-08-13: qty 20

## 2015-08-13 MED ORDER — SODIUM CHLORIDE 0.9 % IV SOLN
Freq: Once | INTRAVENOUS | Status: AC
  Administered 2015-08-13: 10:00:00 via INTRAVENOUS

## 2015-08-13 MED ORDER — PALONOSETRON HCL INJECTION 0.25 MG/5ML
INTRAVENOUS | Status: AC
  Filled 2015-08-13: qty 5

## 2015-08-13 MED ORDER — SODIUM CHLORIDE 0.9 % IV SOLN
400.0000 mg/m2 | Freq: Once | INTRAVENOUS | Status: AC
  Administered 2015-08-13: 836 mg via INTRAVENOUS
  Filled 2015-08-13: qty 21.99

## 2015-08-13 MED ORDER — ZOLPIDEM TARTRATE 5 MG PO TABS: 5.0000 mg | ORAL_TABLET | Freq: Every evening | ORAL | Status: AC | PRN

## 2015-08-13 MED ORDER — PALONOSETRON HCL INJECTION 0.25 MG/5ML
0.2500 mg | Freq: Once | INTRAVENOUS | Status: AC
  Administered 2015-08-13: 0.25 mg via INTRAVENOUS

## 2015-08-13 MED FILL — ZOLPIDEM TARTRATE 5 MG TAB: 5 | 30 days supply | Qty: 30 | Fill #0

## 2015-08-13 MED FILL — SPIRONOLACTONE 50 MG TABLET: 50 | 30 days supply | Qty: 30 | Fill #0

## 2015-08-13 MED FILL — HYDROCODON-APAP 5-325: 5-325 | 12 days supply | Qty: 75 | Fill #0

## 2015-08-13 NOTE — Patient Instructions (Signed)
[facility] Discharge Instructions for Patients Receiving Chemotherapy  Today you received the following chemotherapy agents: Abraxane and Gemzar   To help prevent nausea and vomiting after your treatment, we encourage you to take your nausea medication as directed.    If you develop nausea and vomiting that is not controlled by your nausea medication, call the clinic.   BELOW ARE SYMPTOMS THAT SHOULD BE REPORTED IMMEDIATELY:  *FEVER GREATER THAN 100.5 F  *CHILLS WITH OR WITHOUT FEVER  NAUSEA AND VOMITING THAT IS NOT CONTROLLED WITH YOUR NAUSEA MEDICATION  *UNUSUAL SHORTNESS OF BREATH  *UNUSUAL BRUISING OR BLEEDING  TENDERNESS IN MOUTH AND THROAT WITH OR WITHOUT PRESENCE OF ULCERS  *URINARY PROBLEMS  *BOWEL PROBLEMS  UNUSUAL RASH Items with * indicate a potential emergency and should be followed up as soon as possible.  Feel free to call the clinic you have any questions or concerns. The clinic phone number is [phone].  Please show the CHEMO ALERT CARD at check-in to the Emergency Department and triage nurse.   

## 2015-08-13 NOTE — Progress Notes (Signed)
Pt to receive albumin after paracentesis per Dr. Benay Spice on 08/14/15 and 08/21/15.  Pt aware of times and has no questions at this time.  Per Dr. Benay Spice, pt is to receive 10 grams albumin per liter removed after paracentesis.

## 2015-08-13 NOTE — Telephone Encounter (Signed)
Per staff message and POF I have scheduled appts. Advised scheduler of appts. JMW  

## 2015-08-13 NOTE — Progress Notes (Signed)
Per Dr. Benay Spice, pt is to begin taking Aldactone 50 mg daily and Lasix 20 mg daily.  Pt still in infusion room and notified of med changes.  He has no questions at this time.

## 2015-08-13 NOTE — Patient Instructions (Signed)

## 2015-08-13 NOTE — Progress Notes (Signed)
  [place] OFFICE PROGRESS NOTE   Diagnosis: Pancreas cancer  INTERVAL HISTORY:   Jeremy Moody returns as scheduled. He was last treated with gemcitabine/Abraxane on 07/23/2015. He underwent a paracentesis on 08/07/2015. He reports malaise. He would like to change to an every 3 week chemotherapy scheduled. The leg edema has resolved. He is no longer taking Lasix. He reports stable back pain, relieved with hydrocodone. He stays in a chair most of the day. Objective:  Vital signs in last 24 hours:  Blood pressure 93/63, pulse 101, temperature 98.3 F (36.8 C), resp. rate 18, height 5\' 11"  (1.803 m), weight 177 lb 4.8 oz (80.423 kg), SpO2 100 %.    HEENT: No thrush or ulcers Resp: Lungs clear bilaterally Cardio: Regular rate and rhythm GI: Distended, no mass, no hepatomegaly Vascular: No leg edema  Skin: Chronic stasis change at the lower leg bilaterally with peeling of skin   Portacath/PICC-without erythema  Lab Results:  Lab Results  Component Value Date   WBC 19.3* 08/13/2015   HGB 11.6* 08/13/2015   HCT 32.5* 08/13/2015   MCV 100.6* 08/13/2015   PLT 318 08/13/2015   NEUTROABS 12.2* 08/13/2015    Medications: I have reviewed the patient's current medications.  Assessment/Plan: 1. Pancreas cancer, stage IV  CT abdomen/pelvis 04/29/2015 revealed a pancreas uncinate mass, liver metastases, lung nodules, extensive retroperitoneal/mesenteric, pelvic, and low mediastinal lymphadenopathy  Ultrasound-guided biopsy of a right liver lesion 05/02/2015 confirmed metastatic adenocarcinoma  Cycle 1 gemcitabine/Abraxane 05/07/2015  Cycle 2 gemcitabine/Abraxane 05/21/2015 (50% dose reduction)  Cycle 3 gemcitabine/Abraxane 06/05/2015  Cycle 4 gemcitabine/Abraxane 06/18/2015  Cycle 5 gemcitabine/Abraxane 07/09/2015  Cycle 6 gemcitabine/Abraxane 07/23/2015  CT abdomen/pelvis 08/08/2015-lung nodules are smaller, liver lesions are smaller, pancreas mass and  portacaval lymph node are smaller, increased ascites  Cycle 7 gemcitabine/Abraxane 08/13/2015  2. Anorexia/weight loss  3. Abdominal/back pain secondary to the pancreas mass and liver metastases-improved  4. History of atrial fibrillation and flutter  5. [place] likely secondary to pancreatic insufficiency, he will increase the pancreatic enzyme replacement and try Lomotil  6. Oral candidiasis 05/03/2015-treated with Diflucan; recurrent oral candidiasis 05/28/2015. Treated with Diflucan.  7. Hyperbilirubinemia.   CT abdomen/pelvis 05/14/2015 with no biliary duct dilatation. Mild lateral segment left liver lobe intrahepatic duct dilatation similar and likely secondary to a central left liver lobe metastasis. Decreased size of pancreatic uncinate process primary. Mild improvement in hepatic metastasis. Similar abdominal nodal metastasis. Porta hepatis node larger. Likely smimlar pulmonary metastasis. Increase in small volume of abdominal ascites.  Labs improved 05/21/2015; further improved 05/28/2015, 06/05/2015 and 06/18/2015  8. Hypotension. Metoprolol adjusted to 12.5 mg twice daily 05/21/2015. Metoprolol discontinued 06/25/2015.  9. Ascites-status post a therapeutic paracentesis 07/02/2015- cytology revealed reactive mesothelial cells  10. Anemia secondary to chronic disease and chemotherapy-status post red cell transfusions 06/24/2015 and 07/18/2015    Disposition:   Jeremy Moody appears unchanged. A restaging CT confirms a response to chemotherapy. He has refractory ascites in the setting of hypoalbuminemia and extensive liver metastases. He requires frequent paracentesis procedures.  He will begin a trial of diuretic therapy and give albumin with the paracentesis procedures.  Jeremy Moody would like to changed chemotherapy to a 3 week schedule. He will return for an office visit and chemotherapy in 3 weeks.  Jeremy Coder, MD  08/13/2015  10:13  AM

## 2015-08-13 NOTE — Addendum Note (Signed)
Addended by: San Morelle on: 08/13/2015 11:55 AM   Modules accepted: Orders, Medications

## 2015-08-13 NOTE — Telephone Encounter (Signed)
Jeremy Moody with [facility] calling in reference to Albumin for tomorrow after Paracentesis.  Not in orders and [facility] may not have availability.  Call forwarded to ext [phone].

## 2015-08-14 ENCOUNTER — Ambulatory Visit (HOSPITAL_COMMUNITY)
Admission: RE | Admit: 2015-08-14 | Discharge: 2015-08-14 | Disposition: A | Discharge status: Home / Self Care | Payer: 59 | Source: Ambulatory Visit | Attending: Oncology | Admitting: Oncology

## 2015-08-14 ENCOUNTER — Ambulatory Visit: Payer: 59

## 2015-08-14 ENCOUNTER — Ambulatory Visit (HOSPITAL_BASED_OUTPATIENT_CLINIC_OR_DEPARTMENT_OTHER): Payer: 59

## 2015-08-14 ENCOUNTER — Other Ambulatory Visit: Payer: Self-pay | Admitting: *Deleted

## 2015-08-14 ENCOUNTER — Telehealth: Payer: Self-pay | Admitting: *Deleted

## 2015-08-14 VITALS — BP 92/56 | HR 89 | Temp 97.0°F | Resp 18

## 2015-08-14 DIAGNOSIS — C250 Malignant neoplasm of head of pancreas: Secondary | ICD-10-CM

## 2015-08-14 DIAGNOSIS — E8809 Other disorders of plasma-protein metabolism, not elsewhere classified: Secondary | ICD-10-CM

## 2015-08-14 DIAGNOSIS — R188 Other ascites: Secondary | ICD-10-CM | POA: Diagnosis not present

## 2015-08-14 DIAGNOSIS — Z95828 Presence of other vascular implants and grafts: Secondary | ICD-10-CM

## 2015-08-14 LAB — CANCER ANTIGEN 19-9

## 2015-08-14 MED ORDER — ALBUMIN HUMAN 25 % IV SOLN
62.5000 g | Freq: Once | INTRAVENOUS | Status: AC
  Administered 2015-08-14: 62.5 g via INTRAVENOUS
  Filled 2015-08-14: qty 250

## 2015-08-14 MED ORDER — HEPARIN SOD (PORK) LOCK FLUSH 100 UNIT/ML IV SOLN
500.0000 [IU] | Freq: Once | INTRAVENOUS | Status: AC | PRN
  Administered 2015-08-14: 500 [IU] via INTRAVENOUS
  Filled 2015-08-14: qty 5

## 2015-08-14 MED ORDER — SODIUM CHLORIDE 0.9 % IJ SOLN
10.0000 mL | INTRAMUSCULAR | Status: DC | PRN
  Administered 2015-08-14: 10 mL via INTRAVENOUS
  Filled 2015-08-14: qty 10

## 2015-08-14 NOTE — Procedures (Signed)
Ultrasound-guided therapeutic paracentesis performed yielding 5.7 liters of slightly cloudy yellow colored fluid. No immediate complications.  Jeremy Moody E 11:03 AM 08/14/2015

## 2015-08-14 NOTE — Telephone Encounter (Signed)
Spoke with Jeremy Moody in ultrasound. Pt had 5.7 liters removed during paracentesis today. Pt is in the office for albumin administration after procedure.

## 2015-08-14 NOTE — Patient Instructions (Signed)
Albumin injection °What is this medicine? °ALBUMIN (al BYOO min) is used to treat or prevent shock following serious injury, bleeding, surgery, or burns by increasing the volume of blood plasma. This medicine can also replace low blood protein. °This medicine may be used for other purposes; ask your health care provider or pharmacist if you have questions. °What should I tell my health care provider before I take this medicine? °They need to know if you have any of the following conditions: °-anemia °-heart disease °-kidney disease °-an unusual or allergic reaction to albumin, other medicines, foods, dyes, or preservatives °-pregnant or trying to get pregnant °-breast-feeding °How should I use this medicine? °This medicine is for infusion into a vein. It is given by a health-care professional in a hospital or clinic. °Talk to your pediatrician regarding the use of this medicine in children. While this drug may be prescribed for selected conditions, precautions do apply. °Overdosage: If you think you have taken too much of this medicine contact a poison control center or emergency room at once. °NOTE: This medicine is only for you. Do not share this medicine with others. °What if I miss a dose? °This does not apply. °What may interact with this medicine? °Interactions are not expected. °This list may not describe all possible interactions. Give your health care provider a list of all the medicines, herbs, non-prescription drugs, or dietary supplements you use. Also tell them if you smoke, drink alcohol, or use illegal drugs. Some items may interact with your medicine. °What should I watch for while using this medicine? °Your condition will be closely monitored while you receive this medicine. °Some products are derived from human plasma, and there is a small risk that these products may contain certain types of virus or bacteria. All products are processed to kill most viruses and bacteria. If you have questions  concerning the risk of infections, discuss them with your doctor or health care professional. °What side effects may I notice from receiving this medicine? °Side effects that you should report to your doctor or health care professional as soon as possible: °-allergic reactions like skin rash, itching or hives, swelling of the face, lips, or tongue °-breathing problems °-changes in heartbeat °-fever, chills °-pain, redness or swelling at the injection site °-signs of viral infection including fever, drowsiness, chills, runny nose followed in about 2 weeks by a rash and joint pain °-tightness in the chest °Side effects that usually do not require medical attention (report to your doctor or health care professional if they continue or are bothersome): °-increased salivation °-nausea, vomiting °This list may not describe all possible side effects. Call your doctor for medical advice about side effects. You may report side effects to FDA at 1-800-FDA-1088. °Where should I keep my medicine? °This does not apply. You will not be given this medicine to store at home. °NOTE: This sheet is a summary. It may not cover all possible information. If you have questions about this medicine, talk to your doctor, pharmacist, or health care provider. °  °© 2016, [employer]. (2007-06-30 10:18:55) ° °

## 2015-08-20 ENCOUNTER — Other Ambulatory Visit: Payer: Self-pay | Admitting: Oncology

## 2015-08-20 MED FILL — CREON DR 36,000 UNITS CAP: 36000 | 30 days supply | Qty: 270 | Fill #0

## 2015-08-20 MED FILL — FLECAINIDE ACETATE 100 MG T: 100 | 30 days supply | Qty: 60 | Fill #2

## 2015-08-21 ENCOUNTER — Ambulatory Visit (HOSPITAL_COMMUNITY)
Admission: RE | Admit: 2015-08-21 | Discharge: 2015-08-21 | Disposition: A | Discharge status: Home / Self Care | Payer: 59 | Source: Ambulatory Visit | Attending: Oncology | Admitting: Oncology

## 2015-08-21 ENCOUNTER — Ambulatory Visit (HOSPITAL_BASED_OUTPATIENT_CLINIC_OR_DEPARTMENT_OTHER): Payer: 59

## 2015-08-21 VITALS — BP 89/52 | HR 80 | Temp 97.7°F | Resp 18

## 2015-08-21 DIAGNOSIS — C250 Malignant neoplasm of head of pancreas: Secondary | ICD-10-CM | POA: Insufficient documentation

## 2015-08-21 DIAGNOSIS — C259 Malignant neoplasm of pancreas, unspecified: Secondary | ICD-10-CM

## 2015-08-21 DIAGNOSIS — R188 Other ascites: Secondary | ICD-10-CM | POA: Insufficient documentation

## 2015-08-21 MED ORDER — HEPARIN SOD (PORK) LOCK FLUSH 100 UNIT/ML IV SOLN
500.0000 [IU] | Freq: Once | INTRAVENOUS | Status: AC
  Administered 2015-08-21: 500 [IU] via INTRAVENOUS
  Filled 2015-08-21: qty 5

## 2015-08-21 MED ORDER — SODIUM CHLORIDE 0.9% FLUSH
10.0000 mL | INTRAVENOUS | Status: DC | PRN
  Administered 2015-08-21: 10 mL via INTRAVENOUS
  Filled 2015-08-21: qty 10

## 2015-08-21 MED ORDER — SODIUM CHLORIDE 0.9 % IV SOLN
Freq: Once | INTRAVENOUS | Status: AC
  Administered 2015-08-21: 12:00:00 via INTRAVENOUS

## 2015-08-21 MED ORDER — ALBUMIN HUMAN 25 % IV SOLN
50.0000 g | Freq: Once | INTRAVENOUS | Status: AC
  Administered 2015-08-21: 50 g via INTRAVENOUS
  Filled 2015-08-21: qty 200

## 2015-08-21 NOTE — Procedures (Signed)
Ultrasound-guided therapeutic paracentesis performed yielding 4 liters of turbid yellow colored fluid. No immediate complications.  Temica Righetti E 12:07 PM 08/21/2015

## 2015-08-21 NOTE — Patient Instructions (Signed)
Albumin injection °What is this medicine? °ALBUMIN (al BYOO min) is used to treat or prevent shock following serious injury, bleeding, surgery, or burns by increasing the volume of blood plasma. This medicine can also replace low blood protein. °This medicine may be used for other purposes; ask your health care provider or pharmacist if you have questions. °What should I tell my health care provider before I take this medicine? °They need to know if you have any of the following conditions: °-anemia °-heart disease °-kidney disease °-an unusual or allergic reaction to albumin, other medicines, foods, dyes, or preservatives °-pregnant or trying to get pregnant °-breast-feeding °How should I use this medicine? °This medicine is for infusion into a vein. It is given by a health-care professional in a hospital or clinic. °Talk to your pediatrician regarding the use of this medicine in children. While this drug may be prescribed for selected conditions, precautions do apply. °Overdosage: If you think you have taken too much of this medicine contact a poison control center or emergency room at once. °NOTE: This medicine is only for you. Do not share this medicine with others. °What if I miss a dose? °This does not apply. °What may interact with this medicine? °Interactions are not expected. °This list may not describe all possible interactions. Give your health care provider a list of all the medicines, herbs, non-prescription drugs, or dietary supplements you use. Also tell them if you smoke, drink alcohol, or use illegal drugs. Some items may interact with your medicine. °What should I watch for while using this medicine? °Your condition will be closely monitored while you receive this medicine. °Some products are derived from human plasma, and there is a small risk that these products may contain certain types of virus or bacteria. All products are processed to kill most viruses and bacteria. If you have questions  concerning the risk of infections, discuss them with your doctor or health care professional. °What side effects may I notice from receiving this medicine? °Side effects that you should report to your doctor or health care professional as soon as possible: °-allergic reactions like skin rash, itching or hives, swelling of the face, lips, or tongue °-breathing problems °-changes in heartbeat °-fever, chills °-pain, redness or swelling at the injection site °-signs of viral infection including fever, drowsiness, chills, runny nose followed in about 2 weeks by a rash and joint pain °-tightness in the chest °Side effects that usually do not require medical attention (report to your doctor or health care professional if they continue or are bothersome): °-increased salivation °-nausea, vomiting °This list may not describe all possible side effects. Call your doctor for medical advice about side effects. You may report side effects to FDA at 1-800-FDA-1088. °Where should I keep my medicine? °This does not apply. You will not be given this medicine to store at home. °NOTE: This sheet is a summary. It may not cover all possible information. If you have questions about this medicine, talk to your doctor, pharmacist, or health care provider. °  °© 2016, [employer]. (2007-06-30 10:18:55) ° °

## 2015-08-21 NOTE — Progress Notes (Signed)
Called to ultrasound, pt had 4 liters ascitic fluid removed today. Per Dr. Benay Spice: Pt to receive 50 gram albumin infusion today.

## 2015-08-23 ENCOUNTER — Telehealth: Payer: Self-pay

## 2015-08-23 DIAGNOSIS — C259 Malignant neoplasm of pancreas, unspecified: Secondary | ICD-10-CM

## 2015-08-23 NOTE — Addendum Note (Signed)
Addended by: Brien Few on: 08/23/2015 05:02 PM   Modules accepted: Orders

## 2015-08-23 NOTE — Telephone Encounter (Signed)
Discussed pt's call with Ned Card, NP: Order received for paracentesis and possible albumin infusion on 08/28/15. Pt made aware. He understands he can call radiology to schedule if they don't call him on Monday.

## 2015-08-23 NOTE — Telephone Encounter (Signed)
Patient calling asking to be scheduled for another paracentesis next Wednesday 08/28/15.  He states that he had one on 08/21/15 but he felt that "they didn't get as much fluid off as he would have liked" and is requesting to be set up for another one.  Patient requesting call back to discuss.

## 2015-08-27 ENCOUNTER — Telehealth: Payer: Self-pay | Admitting: *Deleted

## 2015-08-27 ENCOUNTER — Telehealth: Payer: Self-pay | Admitting: Oncology

## 2015-08-27 ENCOUNTER — Other Ambulatory Visit: Payer: Self-pay | Admitting: *Deleted

## 2015-08-27 NOTE — Telephone Encounter (Signed)
Please schedule for albumin at [facility] after the paracentesis

## 2015-08-27 NOTE — Telephone Encounter (Signed)
Albumin has been scheduled at [facility]

## 2015-08-27 NOTE — Telephone Encounter (Signed)
Per staff message and POF I have scheduled appts. Advised scheduler of appts. JMW  

## 2015-08-27 NOTE — Telephone Encounter (Signed)
s.w. pt and advise don 5.11 appts.Marland KitchenMarland KitchenMarland KitchenMarland Kitchenpt ok and aware

## 2015-08-27 NOTE — Telephone Encounter (Signed)
"  Jeremy Moody calling about Need for Albumin.  Called Radiology and the paracentesis haas been scheduled Thursday 08-29-2015 at 10:00.  I need albumin at 11:15 or 11:30 am.  Return number [phone]."

## 2015-08-29 ENCOUNTER — Ambulatory Visit (HOSPITAL_BASED_OUTPATIENT_CLINIC_OR_DEPARTMENT_OTHER): Payer: 59

## 2015-08-29 ENCOUNTER — Encounter: Payer: Self-pay | Admitting: *Deleted

## 2015-08-29 ENCOUNTER — Other Ambulatory Visit: Payer: Self-pay | Admitting: *Deleted

## 2015-08-29 ENCOUNTER — Other Ambulatory Visit: Payer: Self-pay | Admitting: Oncology

## 2015-08-29 ENCOUNTER — Ambulatory Visit (HOSPITAL_COMMUNITY)
Admission: RE | Admit: 2015-08-29 | Discharge: 2015-08-29 | Disposition: A | Discharge status: Home / Self Care | Payer: 59 | Source: Ambulatory Visit | Attending: Nurse Practitioner | Admitting: Nurse Practitioner

## 2015-08-29 VITALS — BP 86/56 | HR 85 | Temp 98.4°F | Resp 20

## 2015-08-29 DIAGNOSIS — R188 Other ascites: Secondary | ICD-10-CM | POA: Insufficient documentation

## 2015-08-29 DIAGNOSIS — C259 Malignant neoplasm of pancreas, unspecified: Secondary | ICD-10-CM

## 2015-08-29 DIAGNOSIS — K8689 Other specified diseases of pancreas: Secondary | ICD-10-CM

## 2015-08-29 DIAGNOSIS — Z8507 Personal history of malignant neoplasm of pancreas: Secondary | ICD-10-CM

## 2015-08-29 DIAGNOSIS — C250 Malignant neoplasm of head of pancreas: Secondary | ICD-10-CM

## 2015-08-29 MED ORDER — SODIUM CHLORIDE 0.9 % IV SOLN
INTRAVENOUS | Status: DC
  Administered 2015-08-29: 11:00:00 via INTRAVENOUS

## 2015-08-29 MED ORDER — ALBUMIN HUMAN 25 % IV SOLN
62.5000 g | Freq: Once | INTRAVENOUS | Status: AC
  Administered 2015-08-29: 62.5 g via INTRAVENOUS
  Filled 2015-08-29: qty 250

## 2015-08-29 MED ORDER — HEPARIN SOD (PORK) LOCK FLUSH 100 UNIT/ML IV SOLN
500.0000 [IU] | INTRAVENOUS | Status: AC | PRN
  Administered 2015-08-29: 500 [IU]
  Filled 2015-08-29: qty 5

## 2015-08-29 MED ORDER — SODIUM CHLORIDE 0.9% FLUSH
10.0000 mL | INTRAVENOUS | Status: AC | PRN
  Administered 2015-08-29: 10 mL
  Filled 2015-08-29: qty 10

## 2015-08-29 NOTE — Progress Notes (Signed)
Per Sonny Masters in Ultrasound, patient had 6 liters of fluid removed during paracentesis today.  Dr. Benay Spice notified and order received for pt to receive 60 grams of Albumin today.  Pharmacy notified.

## 2015-08-29 NOTE — Patient Instructions (Signed)
Albumin injection °What is this medicine? °ALBUMIN (al BYOO min) is used to treat or prevent shock following serious injury, bleeding, surgery, or burns by increasing the volume of blood plasma. This medicine can also replace low blood protein. °This medicine may be used for other purposes; ask your health care provider or pharmacist if you have questions. °What should I tell my health care provider before I take this medicine? °They need to know if you have any of the following conditions: °-anemia °-heart disease °-kidney disease °-an unusual or allergic reaction to albumin, other medicines, foods, dyes, or preservatives °-pregnant or trying to get pregnant °-breast-feeding °How should I use this medicine? °This medicine is for infusion into a vein. It is given by a health-care professional in a hospital or clinic. °Talk to your pediatrician regarding the use of this medicine in children. While this drug may be prescribed for selected conditions, precautions do apply. °Overdosage: If you think you have taken too much of this medicine contact a poison control center or emergency room at once. °NOTE: This medicine is only for you. Do not share this medicine with others. °What if I miss a dose? °This does not apply. °What may interact with this medicine? °Interactions are not expected. °This list may not describe all possible interactions. Give your health care provider a list of all the medicines, herbs, non-prescription drugs, or dietary supplements you use. Also tell them if you smoke, drink alcohol, or use illegal drugs. Some items may interact with your medicine. °What should I watch for while using this medicine? °Your condition will be closely monitored while you receive this medicine. °Some products are derived from human plasma, and there is a small risk that these products may contain certain types of virus or bacteria. All products are processed to kill most viruses and bacteria. If you have questions  concerning the risk of infections, discuss them with your doctor or health care professional. °What side effects may I notice from receiving this medicine? °Side effects that you should report to your doctor or health care professional as soon as possible: °-allergic reactions like skin rash, itching or hives, swelling of the face, lips, or tongue °-breathing problems °-changes in heartbeat °-fever, chills °-pain, redness or swelling at the injection site °-signs of viral infection including fever, drowsiness, chills, runny nose followed in about 2 weeks by a rash and joint pain °-tightness in the chest °Side effects that usually do not require medical attention (report to your doctor or health care professional if they continue or are bothersome): °-increased salivation °-nausea, vomiting °This list may not describe all possible side effects. Call your doctor for medical advice about side effects. You may report side effects to FDA at 1-800-FDA-1088. °Where should I keep my medicine? °This does not apply. You will not be given this medicine to store at home. °NOTE: This sheet is a summary. It may not cover all possible information. If you have questions about this medicine, talk to your doctor, pharmacist, or health care provider. °  °© 2016, [employer]. (2007-06-30 10:18:55) ° °

## 2015-08-29 NOTE — Procedures (Signed)
Successful US guided paracentesis from RLQ.  Yielded 6L of hazy yellow fluid.  No immediate complications.  Pt tolerated well.   Specimen was not sent for labs.  Ascencion Dike PA-C 08/29/2015 10:53 AM

## 2015-08-30 ENCOUNTER — Other Ambulatory Visit: Payer: Self-pay | Admitting: *Deleted

## 2015-08-30 DIAGNOSIS — C259 Malignant neoplasm of pancreas, unspecified: Secondary | ICD-10-CM

## 2015-09-03 ENCOUNTER — Ambulatory Visit (HOSPITAL_BASED_OUTPATIENT_CLINIC_OR_DEPARTMENT_OTHER): Payer: 59

## 2015-09-03 ENCOUNTER — Ambulatory Visit (HOSPITAL_BASED_OUTPATIENT_CLINIC_OR_DEPARTMENT_OTHER): Payer: 59 | Admitting: Oncology

## 2015-09-03 ENCOUNTER — Ambulatory Visit: Payer: 59

## 2015-09-03 ENCOUNTER — Other Ambulatory Visit (HOSPITAL_BASED_OUTPATIENT_CLINIC_OR_DEPARTMENT_OTHER): Payer: 59

## 2015-09-03 ENCOUNTER — Other Ambulatory Visit: Payer: 59

## 2015-09-03 VITALS — BP 85/46 | HR 92 | Temp 97.9°F | Resp 17 | Ht 71.0 in | Wt 174.9 lb

## 2015-09-03 DIAGNOSIS — C787 Secondary malignant neoplasm of liver and intrahepatic bile duct: Secondary | ICD-10-CM

## 2015-09-03 DIAGNOSIS — R197 Diarrhea, unspecified: Secondary | ICD-10-CM

## 2015-09-03 DIAGNOSIS — C250 Malignant neoplasm of head of pancreas: Secondary | ICD-10-CM

## 2015-09-03 DIAGNOSIS — R188 Other ascites: Secondary | ICD-10-CM

## 2015-09-03 DIAGNOSIS — Z5111 Encounter for antineoplastic chemotherapy: Secondary | ICD-10-CM

## 2015-09-03 DIAGNOSIS — R630 Anorexia: Secondary | ICD-10-CM

## 2015-09-03 DIAGNOSIS — M549 Dorsalgia, unspecified: Secondary | ICD-10-CM

## 2015-09-03 DIAGNOSIS — Z95828 Presence of other vascular implants and grafts: Secondary | ICD-10-CM

## 2015-09-03 DIAGNOSIS — D630 Anemia in neoplastic disease: Secondary | ICD-10-CM

## 2015-09-03 DIAGNOSIS — R634 Abnormal weight loss: Secondary | ICD-10-CM | POA: Diagnosis not present

## 2015-09-03 DIAGNOSIS — K8689 Other specified diseases of pancreas: Secondary | ICD-10-CM

## 2015-09-03 DIAGNOSIS — R109 Unspecified abdominal pain: Secondary | ICD-10-CM

## 2015-09-03 DIAGNOSIS — D6481 Anemia due to antineoplastic chemotherapy: Secondary | ICD-10-CM

## 2015-09-03 LAB — CBC WITH DIFFERENTIAL/PLATELET
BASO%: 0.6 % (ref 0.0–2.0)
BASOS ABS: 0.1 10*3/uL (ref 0.0–0.1)
EOS%: 1.6 % (ref 0.0–7.0)
Eosinophils Absolute: 0.3 10*3/uL (ref 0.0–0.5)
HEMATOCRIT: 27.7 % — AB (ref 38.4–49.9)
HGB: 10 g/dL — ABNORMAL LOW (ref 13.0–17.1)
LYMPH%: 18.9 % (ref 14.0–49.0)
MCH: 37.2 pg — AB (ref 27.2–33.4)
MCHC: 36.1 g/dL — AB (ref 32.0–36.0)
MCV: 103 fL — AB (ref 79.3–98.0)
MONO#: 3.2 10*3/uL — AB (ref 0.1–0.9)
MONO%: 18.9 % — ABNORMAL HIGH (ref 0.0–14.0)
NEUT#: 10.3 10*3/uL — ABNORMAL HIGH (ref 1.5–6.5)
NEUT%: 60 % (ref 39.0–75.0)
PLATELETS: 255 10*3/uL (ref 140–400)
RBC: 2.69 10*6/uL — AB (ref 4.20–5.82)
RDW: 27.1 % — AB (ref 11.0–14.6)
WBC: 17.1 10*3/uL — ABNORMAL HIGH (ref 4.0–10.3)
lymph#: 3.2 10*3/uL (ref 0.9–3.3)

## 2015-09-03 LAB — COMPREHENSIVE METABOLIC PANEL
ALT: 16 U/L (ref 0–55)
ANION GAP: 6 meq/L (ref 3–11)
AST: 43 U/L — ABNORMAL HIGH (ref 5–34)
Albumin: 2.3 g/dL — ABNORMAL LOW (ref 3.5–5.0)
Alkaline Phosphatase: 283 U/L — ABNORMAL HIGH (ref 40–150)
BUN: 32.8 mg/dL — ABNORMAL HIGH (ref 7.0–26.0)
CALCIUM: 8.2 mg/dL — AB (ref 8.4–10.4)
CHLORIDE: 104 meq/L (ref 98–109)
CO2: 17 meq/L — AB (ref 22–29)
CREATININE: 1.7 mg/dL — AB (ref 0.7–1.3)
EGFR: 40 mL/min/{1.73_m2} — AB (ref >90)
Glucose: 88 mg/dl (ref 70–140)
POTASSIUM: 4.4 meq/L (ref 3.5–5.1)
Sodium: 127 mEq/L — ABNORMAL LOW (ref 136–145)
Total Bilirubin: 1.26 mg/dL — ABNORMAL HIGH (ref 0.20–1.20)
Total Protein: 5.2 g/dL — ABNORMAL LOW (ref 6.4–8.3)

## 2015-09-03 LAB — TECHNOLOGIST REVIEW

## 2015-09-03 MED ORDER — SODIUM CHLORIDE 0.9 % IJ SOLN
10.0000 mL | INTRAMUSCULAR | Status: DC | PRN
  Administered 2015-09-03: 10 mL via INTRAVENOUS
  Filled 2015-09-03: qty 10

## 2015-09-03 MED ORDER — DEXAMETHASONE SODIUM PHOSPHATE 100 MG/10ML IJ SOLN
10.0000 mg | Freq: Once | INTRAMUSCULAR | Status: AC
  Administered 2015-09-03: 10 mg via INTRAVENOUS
  Filled 2015-09-03: qty 1

## 2015-09-03 MED ORDER — SODIUM CHLORIDE 0.9 % IJ SOLN
10.0000 mL | INTRAMUSCULAR | Status: DC | PRN
  Administered 2015-09-03: 10 mL
  Filled 2015-09-03: qty 10

## 2015-09-03 MED ORDER — SODIUM CHLORIDE 0.9 % IV SOLN
Freq: Once | INTRAVENOUS | Status: AC
  Administered 2015-09-03: 13:00:00 via INTRAVENOUS

## 2015-09-03 MED ORDER — SODIUM CHLORIDE 0.9 % IV SOLN
400.0000 mg/m2 | Freq: Once | INTRAVENOUS | Status: AC
  Administered 2015-09-03: 836 mg via INTRAVENOUS
  Filled 2015-09-03: qty 21.99

## 2015-09-03 MED ORDER — HYDROCODONE-ACETAMINOPHEN 5-325 MG PO TABS: 1.0000 | ORAL_TABLET | ORAL | Status: AC | PRN

## 2015-09-03 MED ORDER — PACLITAXEL PROTEIN-BOUND CHEMO INJECTION 100 MG
50.0000 mg/m2 | Freq: Once | INTRAVENOUS | Status: AC
  Administered 2015-09-03: 100 mg via INTRAVENOUS
  Filled 2015-09-03: qty 20

## 2015-09-03 MED ORDER — HEPARIN SOD (PORK) LOCK FLUSH 100 UNIT/ML IV SOLN
500.0000 [IU] | Freq: Once | INTRAVENOUS | Status: AC | PRN
  Administered 2015-09-03: 500 [IU]
  Filled 2015-09-03: qty 5

## 2015-09-03 MED ORDER — PALONOSETRON HCL INJECTION 0.25 MG/5ML
0.2500 mg | Freq: Once | INTRAVENOUS | Status: AC
  Administered 2015-09-03: 0.25 mg via INTRAVENOUS

## 2015-09-03 MED ORDER — PALONOSETRON HCL INJECTION 0.25 MG/5ML
INTRAVENOUS | Status: AC
  Filled 2015-09-03: qty 5

## 2015-09-03 NOTE — Addendum Note (Signed)
Addended by: Rosalio Macadamia C on: 09/03/2015 01:11 PM   Modules accepted: Orders

## 2015-09-03 NOTE — Progress Notes (Signed)
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 He was last treated with gemcitabine/Abraxane 08/13/2015. He reports tolerating the chemotherapy well. He has noted improved energy with the three-week chemotherapy interval.  He continues weekly paracentesis procedures, last on 08/29/2015. He does not believe the albumin infusions have helped.  He continues to have malaise. He is now taking 2-3 hydrocodone tablets per day for relief of pain. The leg edema remains improved. His appetite is moderate.  Objective:  Vital signs in last 24 hours:  Blood pressure 85/46, pulse 92, temperature 97.9 F (36.6 C), temperature source Oral, resp. rate 17, height 5\' 11"  (1.803 m), weight 174 lb 14.4 oz (79.334 kg), SpO2 99 %.    HEENT: No thrush, sclera anicteric Resp: Lungs clear bilaterally Cardio: Regular rate and rhythm GI: The abdomen is distended with ascites. Nontender, no hepatomegaly Vascular: No leg edema  Skin: Healed pressure ulcer at the upper back, chronic stasis change at the lower leg bilaterally   Portacath/PICC-without erythema  Lab Results:  Lab Results  Component Value Date   WBC 17.1* 09/03/2015   HGB 10.0* 09/03/2015   HCT 27.7* 09/03/2015   MCV 103.0* 09/03/2015   PLT 255 09/03/2015   NEUTROABS 10.3* 09/03/2015   CA 19-9 on 08/13/2015: 14,081  Medications: I have reviewed the patient's current medications.  Assessment/Plan: 1. Pancreas cancer, stage IV  CT abdomen/pelvis 04/29/2015 revealed a pancreas uncinate mass, liver metastases, lung nodules, extensive retroperitoneal/mesenteric, pelvic, and low mediastinal lymphadenopathy  Ultrasound-guided biopsy of a right liver lesion 05/02/2015 confirmed metastatic adenocarcinoma  Cycle 1 gemcitabine/Abraxane 05/07/2015  Cycle 2 gemcitabine/Abraxane 05/21/2015 (50% dose reduction)  Cycle 3 gemcitabine/Abraxane  06/05/2015  Cycle 4 gemcitabine/Abraxane 06/18/2015  Cycle 5 gemcitabine/Abraxane 07/09/2015  Cycle 6 gemcitabine/Abraxane 07/23/2015  CT abdomen/pelvis 08/08/2015-lung nodules are smaller, liver lesions are smaller, pancreas mass and portacaval lymph node are smaller, increased ascites  Cycle 7 gemcitabine/Abraxane 08/13/2015  2. Anorexia/weight loss  3. Abdominal/back pain secondary to the pancreas mass and liver metastases-improved  4. History of atrial fibrillation and flutter  5. [place] likely secondary to pancreatic insufficiency, he continues pancreatic enzyme replacement  6. Oral candidiasis 05/03/2015-treated with Diflucan; recurrent oral candidiasis 05/28/2015. Treated with Diflucan.  7. Hyperbilirubinemia.   CT abdomen/pelvis 05/14/2015 with no biliary duct dilatation. Mild lateral segment left liver lobe intrahepatic duct dilatation similar and likely secondary to a central left liver lobe metastasis. Decreased size of pancreatic uncinate process primary. Mild improvement in hepatic metastasis. Similar abdominal nodal metastasis. Porta hepatis node larger. Likely smimlar pulmonary metastasis. Increase in small volume of abdominal ascites.  Labs improved 05/21/2015; further improved 05/28/2015, 06/05/2015 and 06/18/2015  8. Hypotension. Metoprolol adjusted to 12.5 mg twice daily 05/21/2015. Metoprolol discontinued 06/25/2015.  9. Ascites-status post repeat therapeutic paracentesis procedures, cytology negative on 07/02/2015, 07/19/2015, 07/25/2015, and 08/01/2015, maintained on Lasix/Aldactone  10. 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 There is no clinical evidence for progression of the pancreas cancer. The CA 19-9 was stable 3 weeks ago. The plan is to continue gemcitabine/Abraxane.  We discussed CPR and ACLS  issues. He will be placed on a no CODE BLUE status. We discussed palliative care and hospice referrals. He would like to hold off on this for now. His family will check into getting help at home for bathing.  Dr. Redmond Pulling will return for an  office visit and chemotherapy in 3 weeks. He will continue weekly paracentesis procedures.  Betsy Coder, MD  09/03/2015  11:24 AM

## 2015-09-03 NOTE — Progress Notes (Signed)
Dr. Benay Spice reviewed today's lab results. Gave ok to treat after pharmacist reviews.

## 2015-09-03 NOTE — Patient Instructions (Signed)

## 2015-09-04 ENCOUNTER — Telehealth: Payer: Self-pay | Admitting: *Deleted

## 2015-09-04 ENCOUNTER — Ambulatory Visit (HOSPITAL_BASED_OUTPATIENT_CLINIC_OR_DEPARTMENT_OTHER): Payer: 59

## 2015-09-04 ENCOUNTER — Ambulatory Visit (HOSPITAL_COMMUNITY)
Admission: RE | Admit: 2015-09-04 | Discharge: 2015-09-04 | Disposition: A | Discharge status: Home / Self Care | Payer: 59 | Source: Ambulatory Visit | Attending: Oncology | Admitting: Oncology

## 2015-09-04 VITALS — BP 88/56 | HR 88 | Temp 97.0°F | Resp 18

## 2015-09-04 DIAGNOSIS — R188 Other ascites: Secondary | ICD-10-CM | POA: Insufficient documentation

## 2015-09-04 DIAGNOSIS — C250 Malignant neoplasm of head of pancreas: Secondary | ICD-10-CM

## 2015-09-04 DIAGNOSIS — C259 Malignant neoplasm of pancreas, unspecified: Secondary | ICD-10-CM | POA: Insufficient documentation

## 2015-09-04 DIAGNOSIS — Z95828 Presence of other vascular implants and grafts: Secondary | ICD-10-CM

## 2015-09-04 LAB — CANCER ANTIGEN 19-9

## 2015-09-04 MED ORDER — SODIUM CHLORIDE 0.9 % IV SOLN
INTRAVENOUS | Status: DC
  Administered 2015-09-04: 12:00:00 via INTRAVENOUS

## 2015-09-04 MED ORDER — ALBUMIN HUMAN 25 % IV SOLN
50.0000 g | Freq: Once | INTRAVENOUS | Status: AC
  Administered 2015-09-04: 50 g via INTRAVENOUS
  Filled 2015-09-04: qty 200

## 2015-09-04 MED ORDER — SODIUM CHLORIDE 0.9 % IJ SOLN
10.0000 mL | INTRAMUSCULAR | Status: DC | PRN
  Administered 2015-09-04: 10 mL via INTRAVENOUS
  Filled 2015-09-04: qty 10

## 2015-09-04 MED ORDER — HEPARIN SOD (PORK) LOCK FLUSH 100 UNIT/ML IV SOLN
500.0000 [IU] | Freq: Once | INTRAVENOUS | Status: AC | PRN
  Administered 2015-09-04: 500 [IU] via INTRAVENOUS
  Filled 2015-09-04: qty 5

## 2015-09-04 NOTE — Patient Instructions (Signed)
Albumin injection °What is this medicine? °ALBUMIN (al BYOO min) is used to treat or prevent shock following serious injury, bleeding, surgery, or burns by increasing the volume of blood plasma. This medicine can also replace low blood protein. °This medicine may be used for other purposes; ask your health care provider or pharmacist if you have questions. °What should I tell my health care provider before I take this medicine? °They need to know if you have any of the following conditions: °-anemia °-heart disease °-kidney disease °-an unusual or allergic reaction to albumin, other medicines, foods, dyes, or preservatives °-pregnant or trying to get pregnant °-breast-feeding °How should I use this medicine? °This medicine is for infusion into a vein. It is given by a health-care professional in a hospital or clinic. °Talk to your pediatrician regarding the use of this medicine in children. While this drug may be prescribed for selected conditions, precautions do apply. °Overdosage: If you think you have taken too much of this medicine contact a poison control center or emergency room at once. °NOTE: This medicine is only for you. Do not share this medicine with others. °What if I miss a dose? °This does not apply. °What may interact with this medicine? °Interactions are not expected. °This list may not describe all possible interactions. Give your health care provider a list of all the medicines, herbs, non-prescription drugs, or dietary supplements you use. Also tell them if you smoke, drink alcohol, or use illegal drugs. Some items may interact with your medicine. °What should I watch for while using this medicine? °Your condition will be closely monitored while you receive this medicine. °Some products are derived from human plasma, and there is a small risk that these products may contain certain types of virus or bacteria. All products are processed to kill most viruses and bacteria. If you have questions  concerning the risk of infections, discuss them with your doctor or health care professional. °What side effects may I notice from receiving this medicine? °Side effects that you should report to your doctor or health care professional as soon as possible: °-allergic reactions like skin rash, itching or hives, swelling of the face, lips, or tongue °-breathing problems °-changes in heartbeat °-fever, chills °-pain, redness or swelling at the injection site °-signs of viral infection including fever, drowsiness, chills, runny nose followed in about 2 weeks by a rash and joint pain °-tightness in the chest °Side effects that usually do not require medical attention (report to your doctor or health care professional if they continue or are bothersome): °-increased salivation °-nausea, vomiting °This list may not describe all possible side effects. Call your doctor for medical advice about side effects. You may report side effects to FDA at 1-800-FDA-1088. °Where should I keep my medicine? °This does not apply. You will not be given this medicine to store at home. °NOTE: This sheet is a summary. It may not cover all possible information. If you have questions about this medicine, talk to your doctor, pharmacist, or health care provider. °  °© 2016, [employer]. (2007-06-30 10:18:55) ° °

## 2015-09-04 NOTE — Telephone Encounter (Signed)
-----   Message from Ladell Pier, MD sent at 09/04/2015  6:42 AM EDT ----- Please call patient, ca19-9 is better

## 2015-09-04 NOTE — Telephone Encounter (Signed)
Spoke with pt in infusion room: he wants to continue albumin for now. Instructed him to discontinue Lasix and Aldactone, per Dr. Benay Spice due to elevated creatinine. Pt stated he will also discontinue potassium.

## 2015-09-04 NOTE — Telephone Encounter (Signed)
Pt notified of CA19-9 result. He voiced appreciation for call.

## 2015-09-04 NOTE — Procedures (Signed)
Ultrasound-guided therapeutic paracentesis performed yielding 6 liters of slightly hazy, yellow fluid. No immediate complications. He will receive IV albumin in [place] postprocedure.

## 2015-09-04 NOTE — Progress Notes (Addendum)
Albumin infusion total dose is 50 gram.  To achieve total dose, Pharmacy dispensed 4 bottles of Albumin 25%, 12.5 grams each bottle.  First bottle hung at 12:20 pm and last bottle for total dose of 50 gram was complete at 3:05 pm.

## 2015-09-05 MED FILL — HYDROCODON-APAP 5-325: 5-325 | 17 days supply | Qty: 100 | Fill #0

## 2015-09-10 ENCOUNTER — Ambulatory Visit (HOSPITAL_BASED_OUTPATIENT_CLINIC_OR_DEPARTMENT_OTHER): Payer: 59

## 2015-09-10 ENCOUNTER — Ambulatory Visit (HOSPITAL_COMMUNITY): Admission: RE | Admit: 2015-09-10 | Payer: 59 | Source: Ambulatory Visit

## 2015-09-10 ENCOUNTER — Other Ambulatory Visit: Payer: Self-pay | Admitting: *Deleted

## 2015-09-10 ENCOUNTER — Ambulatory Visit (HOSPITAL_COMMUNITY)
Admission: RE | Admit: 2015-09-10 | Discharge: 2015-09-10 | Disposition: A | Discharge status: Home / Self Care | Payer: 59 | Source: Ambulatory Visit | Attending: Oncology | Admitting: Oncology

## 2015-09-10 ENCOUNTER — Other Ambulatory Visit (HOSPITAL_BASED_OUTPATIENT_CLINIC_OR_DEPARTMENT_OTHER): Payer: 59

## 2015-09-10 ENCOUNTER — Ambulatory Visit (HOSPITAL_BASED_OUTPATIENT_CLINIC_OR_DEPARTMENT_OTHER): Payer: 59 | Admitting: Oncology

## 2015-09-10 ENCOUNTER — Telehealth: Payer: Self-pay | Admitting: Oncology

## 2015-09-10 ENCOUNTER — Telehealth: Payer: Self-pay | Admitting: *Deleted

## 2015-09-10 VITALS — BP 87/53 | HR 107 | Temp 97.6°F | Resp 17 | Ht 71.0 in | Wt 166.8 lb

## 2015-09-10 VITALS — BP 106/57 | HR 107 | Temp 97.6°F | Resp 18

## 2015-09-10 DIAGNOSIS — L89301 Pressure ulcer of unspecified buttock, stage 1: Secondary | ICD-10-CM | POA: Diagnosis not present

## 2015-09-10 DIAGNOSIS — C787 Secondary malignant neoplasm of liver and intrahepatic bile duct: Secondary | ICD-10-CM

## 2015-09-10 DIAGNOSIS — D649 Anemia, unspecified: Secondary | ICD-10-CM | POA: Insufficient documentation

## 2015-09-10 DIAGNOSIS — R110 Nausea: Secondary | ICD-10-CM

## 2015-09-10 DIAGNOSIS — C259 Malignant neoplasm of pancreas, unspecified: Secondary | ICD-10-CM

## 2015-09-10 DIAGNOSIS — C772 Secondary and unspecified malignant neoplasm of intra-abdominal lymph nodes: Secondary | ICD-10-CM | POA: Diagnosis not present

## 2015-09-10 DIAGNOSIS — G9340 Encephalopathy, unspecified: Secondary | ICD-10-CM | POA: Diagnosis not present

## 2015-09-10 DIAGNOSIS — E872 Acidosis: Secondary | ICD-10-CM | POA: Diagnosis not present

## 2015-09-10 DIAGNOSIS — R531 Weakness: Secondary | ICD-10-CM | POA: Diagnosis not present

## 2015-09-10 DIAGNOSIS — D61810 Antineoplastic chemotherapy induced pancytopenia: Secondary | ICD-10-CM | POA: Diagnosis not present

## 2015-09-10 DIAGNOSIS — C250 Malignant neoplasm of head of pancreas: Secondary | ICD-10-CM

## 2015-09-10 DIAGNOSIS — R188 Other ascites: Secondary | ICD-10-CM

## 2015-09-10 DIAGNOSIS — R109 Unspecified abdominal pain: Secondary | ICD-10-CM | POA: Diagnosis not present

## 2015-09-10 DIAGNOSIS — K5903 Drug induced constipation: Secondary | ICD-10-CM

## 2015-09-10 DIAGNOSIS — N179 Acute kidney failure, unspecified: Secondary | ICD-10-CM | POA: Diagnosis not present

## 2015-09-10 DIAGNOSIS — E860 Dehydration: Secondary | ICD-10-CM | POA: Diagnosis not present

## 2015-09-10 DIAGNOSIS — R627 Adult failure to thrive: Secondary | ICD-10-CM | POA: Diagnosis not present

## 2015-09-10 DIAGNOSIS — R5381 Other malaise: Secondary | ICD-10-CM

## 2015-09-10 DIAGNOSIS — E876 Hypokalemia: Secondary | ICD-10-CM | POA: Diagnosis not present

## 2015-09-10 DIAGNOSIS — R54 Age-related physical debility: Secondary | ICD-10-CM | POA: Diagnosis not present

## 2015-09-10 LAB — COMPREHENSIVE METABOLIC PANEL
ALT: 23 U/L (ref 0–55)
AST: 33 U/L (ref 5–34)
Albumin: 2.3 g/dL — ABNORMAL LOW (ref 3.5–5.0)
Alkaline Phosphatase: 249 U/L — ABNORMAL HIGH (ref 40–150)
Anion Gap: 8 mEq/L (ref 3–11)
BUN: 61.3 mg/dL — AB (ref 7.0–26.0)
CHLORIDE: 107 meq/L (ref 98–109)
CO2: 15 meq/L — AB (ref 22–29)
CREATININE: 1.6 mg/dL — AB (ref 0.7–1.3)
Calcium: 8.3 mg/dL — ABNORMAL LOW (ref 8.4–10.4)
EGFR: 43 mL/min/{1.73_m2} — ABNORMAL LOW (ref >90)
Glucose: 111 mg/dl (ref 70–140)
Potassium: 5.2 mEq/L — ABNORMAL HIGH (ref 3.5–5.1)
SODIUM: 130 meq/L — AB (ref 136–145)
Total Bilirubin: 2.53 mg/dL — ABNORMAL HIGH (ref 0.20–1.20)
Total Protein: 5 g/dL — ABNORMAL LOW (ref 6.4–8.3)

## 2015-09-10 LAB — CBC WITH DIFFERENTIAL/PLATELET
BASO%: 0.3 % (ref 0.0–2.0)
Basophils Absolute: 0 10*3/uL (ref 0.0–0.1)
EOS ABS: 0 10*3/uL (ref 0.0–0.5)
EOS%: 0 % (ref 0.0–7.0)
HCT: 23.6 % — ABNORMAL LOW (ref 38.4–49.9)
HGB: 8.5 g/dL — ABNORMAL LOW (ref 13.0–17.1)
LYMPH#: 0.7 10*3/uL — AB (ref 0.9–3.3)
LYMPH%: 22.1 % (ref 14.0–49.0)
MCH: 37.3 pg — ABNORMAL HIGH (ref 27.2–33.4)
MCHC: 36 g/dL (ref 32.0–36.0)
MCV: 103.5 fL — ABNORMAL HIGH (ref 79.3–98.0)
MONO#: 0.1 10*3/uL (ref 0.1–0.9)
MONO%: 1.7 % (ref 0.0–14.0)
NEUT%: 75.9 % — ABNORMAL HIGH (ref 39.0–75.0)
NEUTROS ABS: 2.3 10*3/uL (ref 1.5–6.5)
Platelets: 46 10*3/uL — ABNORMAL LOW (ref 140–400)
RBC: 2.28 10*6/uL — AB (ref 4.20–5.82)
RDW: 26.2 % — AB (ref 11.0–14.6)
WBC: 3 10*3/uL — AB (ref 4.0–10.3)

## 2015-09-10 LAB — PREPARE RBC (CROSSMATCH)

## 2015-09-10 MED ORDER — SODIUM CHLORIDE 0.9% FLUSH
10.0000 mL | INTRAVENOUS | Status: AC | PRN
  Administered 2015-09-10: 10 mL
  Filled 2015-09-10: qty 10

## 2015-09-10 MED ORDER — ONDANSETRON HCL 8 MG PO TABS
ORAL_TABLET | ORAL | Status: AC
  Filled 2015-09-10: qty 1

## 2015-09-10 MED ORDER — HEPARIN SOD (PORK) LOCK FLUSH 100 UNIT/ML IV SOLN
500.0000 [IU] | Freq: Every day | INTRAVENOUS | Status: AC | PRN
  Administered 2015-09-10: 500 [IU]
  Filled 2015-09-10: qty 5

## 2015-09-10 MED ORDER — ONDANSETRON HCL 8 MG PO TABS: 8.0000 mg | ORAL_TABLET | Freq: Three times a day (TID) | ORAL | Status: DC | PRN

## 2015-09-10 MED ORDER — LORAZEPAM 0.5 MG PO TABS: 0.5000 mg | ORAL_TABLET | Freq: Four times a day (QID) | ORAL | Status: AC | PRN

## 2015-09-10 MED ORDER — SODIUM CHLORIDE 0.9 % IV SOLN: 250.0000 mL | Freq: Once | INTRAVENOUS | Status: DC

## 2015-09-10 MED ORDER — ONDANSETRON HCL 8 MG PO TABS
8.0000 mg | ORAL_TABLET | Freq: Once | ORAL | Status: AC
  Administered 2015-09-10: 8 mg via ORAL

## 2015-09-10 MED FILL — LORazepam 0.5 MG TABS: 0.5 | 7 days supply | Qty: 30 | Fill #0

## 2015-09-10 NOTE — Telephone Encounter (Signed)
Per staff message and POF I have scheduled appts. Advised scheduler of appts. JMW  

## 2015-09-10 NOTE — Procedures (Signed)
Ultrasound-guided therapeutic paracentesis performed yielding 4 liters of cloudy yellow colored fluid. No immediate complications.  Tytan Sandate E 3:22 PM 09/10/2015

## 2015-09-10 NOTE — Progress Notes (Signed)
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 He completed another treatment with gemcitabine/Abraxane on 09/03/2015. He complains of malaise and constipation. He is passing flatus He underwent a paracentesis on 09/04/2015 and requests another today. He had an episode of emesis at the Cancer center today.  Objective:  Vital signs in last 24 hours:  Blood pressure 87/53, pulse 107, temperature 97.6 F (36.4 C), temperature source Oral, resp. rate 17, height 5\' 11"  (1.803 m), weight 166 lb 12.8 oz (75.66 kg), SpO2 100 %.    HEENT: No thrush Resp: Lungs clear bilaterally Cardio: Regular rate and rhythm GI: No hepatomegaly, distended, bowel sounds are present Vascular: No leg edema Neuro: Alert  Skin: Small ecchymoses at the lower arms and hands   Portacath/PICC-without erythema  Lab Results:  Lab Results  Component Value Date   WBC 3.0* 09/10/2015   HGB 8.5* 09/10/2015   HCT 23.6* 09/10/2015   MCV 103.5* 09/10/2015   PLT 46* 09/10/2015   NEUTROABS 2.3 09/10/2015  BUN 61.3, creatinine 1.6, potassium 5.2, albumin 2.3, bilirubin 2.53  CA 19-9 on 09/03/2015-4313  Medications: I have reviewed the patient's current medications.  Assessment/Plan: 1. Pancreas cancer, stage IV  CT abdomen/pelvis 04/29/2015 revealed a pancreas uncinate mass, liver metastases, lung nodules, extensive retroperitoneal/mesenteric, pelvic, and low mediastinal lymphadenopathy  Ultrasound-guided biopsy of a right liver lesion 05/02/2015 confirmed metastatic adenocarcinoma  Cycle 1 gemcitabine/Abraxane 05/07/2015  Cycle 2 gemcitabine/Abraxane 05/21/2015 (50% dose reduction)  Cycle 3 gemcitabine/Abraxane 06/05/2015  Cycle 4 gemcitabine/Abraxane 06/18/2015  Cycle 5 gemcitabine/Abraxane 07/09/2015  Cycle 6 gemcitabine/Abraxane 07/23/2015  CT abdomen/pelvis 08/08/2015-lung nodules are smaller,  liver lesions are smaller, pancreas mass and portacaval lymph node are smaller, increased ascites  Cycle 7 gemcitabine/Abraxane 08/13/2015  Cycle 8 gemcitabine/Abraxane 09/03/2015  2. Anorexia/weight loss  3. Abdominal/back pain secondary to the pancreas mass and liver metastases-improved  4. History of atrial fibrillation and flutter  5. [place] likely secondary to pancreatic insufficiency, he continues pancreatic enzyme replacement  6. Oral candidiasis 05/03/2015-treated with Diflucan; recurrent oral candidiasis 05/28/2015. Treated with Diflucan.  7. Hyperbilirubinemia.   CT abdomen/pelvis 05/14/2015 with no biliary duct dilatation. Mild lateral segment left liver lobe intrahepatic duct dilatation similar and likely secondary to a central left liver lobe metastasis. Decreased size of pancreatic uncinate process primary. Mild improvement in hepatic metastasis. Similar abdominal nodal metastasis. Porta hepatis node larger. Likely smimlar pulmonary metastasis. Increase in small volume of abdominal ascites.   8. Hypotension. Metoprolol adjusted to 12.5 mg twice daily 05/21/2015. Metoprolol discontinued 06/25/2015.  9. Ascites-status post repeat therapeutic paracentesis procedures, cytology negative on 07/02/2015, 07/19/2015, 07/25/2015, and 08/01/2015  10. Anemia secondary to chronic disease and chemotherapy-status post red cell transfusions 06/24/2015 and 07/18/2015  11.   Thrombocytopenia secondary to chemotherapy   Disposition:  Dr. Redmond Pulling has metastatic pancreas cancer. He appears ill today. He is symptomatic with nausea, abdominal pain/distention, and constipation. He complains of malaise and would like a red cell transfusion. We will arrange for a red cell transfusion and therapeutic paracentesis today.  The constipation may be related to narcotics, chemotherapy, or a bowel obstruction. He declines a plain x-ray of the abdomen today. He will  begin MiraLAX/Senokot and a low residual diet.  His overall performance status has declined significantly over the past several weeks. He agrees to a palliative care referral. I contacted Dr. Konrad Dolores and they will see him this week.  Dr. Redmond Pulling will  return for an office visit and further discussion on 09/13/2015. We discontinued Creon, potassium, and aspirin today.  Betsy Coder, MD  09/10/2015  1:04 PM

## 2015-09-10 NOTE — Telephone Encounter (Signed)
Per [facility], Zofran interacts with Flecainide and question if Zofran should be dispensed as written.  Per Dr. Benay Spice, d/c Zofran and order Ativan 0.5 mg PO q 6hrs. PRN nausea.  [facility] notified of change.

## 2015-09-10 NOTE — Addendum Note (Signed)
Addended by: [place] More[place] on: 09/10/2015 04:56 PM   Modules accepted: Medications

## 2015-09-10 NOTE — Progress Notes (Unsigned)
Call received from pt.'s wife stating that pt would like paracentesis today d/t increase in abdominal pain.  Pt.'s wife states that pt has been confused, constipated, not eating and not sleeping.  Spoke with Dr. Benay Spice regarding pt.'s condition and pt to be seen today by Dr. Benay Spice with paracentesis after MD visit.  Per Ultrasound, space available at 2:00PM for paracentesis.  Return call placed to pt.'s wife with above information.  She verbalized an understanding to be at [facility] at 11:30am for labs prior to MD visit.

## 2015-09-10 NOTE — Telephone Encounter (Signed)
spoke w/ pt.. confirmed added apts for tod

## 2015-09-10 NOTE — Patient Instructions (Signed)
Blood Transfusion, Care After °Refer to this sheet in the next few weeks. These instructions provide you with information about caring for yourself after your procedure. Your health care provider may also give you more specific instructions. Your treatment has been planned according to current medical practices, but problems sometimes occur. Call your health care provider if you have any problems or questions after your procedure. °WHAT TO EXPECT AFTER THE PROCEDURE °After your procedure, it is common to have: °· Bruising and soreness at the IV site. °· Chills or fever. °· Headache. °HOME CARE INSTRUCTIONS °· Take medicines only as directed by your health care provider. Ask your health care provider if you can take an over-the-counter pain reliever in case you have a fever or headache a day or two after your transfusion. °· Return to your normal activities as directed by your health care provider. °SEEK MEDICAL CARE IF:  °· You develop redness or irritation at your IV site. °· You have persistent fever, chills, or headache. °· Your urine is darker than normal. °· Your urine turns pink, red, or brown.   °· The white part of your eye turns yellow (jaundice).   °· You feel weak after doing your normal activities.   °SEEK IMMEDIATE MEDICAL CARE IF:  °· You have trouble breathing. °· You have fever and chills along with: °¨ Anxiety. °¨ Chest or back pain. °¨ Flushed skin. °¨ Clammy skin. °¨ A rapid heartbeat. °¨ Nausea. °  °This information is not intended to replace advice given to you by your health care provider. Make sure you discuss any questions you have with your health care provider. °  °Document Released: 04/27/2014 Document Reviewed: 04/27/2014 °Elsevier Interactive Patient Education ©2016 [employer] ° °

## 2015-09-11 ENCOUNTER — Encounter (HOSPITAL_COMMUNITY): Payer: Self-pay | Admitting: Emergency Medicine

## 2015-09-11 ENCOUNTER — Ambulatory Visit (HOSPITAL_COMMUNITY): Payer: 59

## 2015-09-11 ENCOUNTER — Encounter: Payer: Self-pay | Admitting: *Deleted

## 2015-09-11 ENCOUNTER — Inpatient Hospital Stay (HOSPITAL_COMMUNITY)
Admission: EM | Admit: 2015-09-11 | Discharge: 2015-09-19 | DRG: 640 | Disposition: E | Payer: 59 | Attending: Internal Medicine | Admitting: Internal Medicine

## 2015-09-11 ENCOUNTER — Other Ambulatory Visit: Payer: Self-pay

## 2015-09-11 DIAGNOSIS — R54 Age-related physical debility: Secondary | ICD-10-CM | POA: Diagnosis not present

## 2015-09-11 DIAGNOSIS — E872 Acidosis, unspecified: Secondary | ICD-10-CM

## 2015-09-11 DIAGNOSIS — I9589 Other hypotension: Secondary | ICD-10-CM | POA: Diagnosis present

## 2015-09-11 DIAGNOSIS — Z66 Do not resuscitate: Secondary | ICD-10-CM | POA: Diagnosis present

## 2015-09-11 DIAGNOSIS — C259 Malignant neoplasm of pancreas, unspecified: Secondary | ICD-10-CM | POA: Diagnosis present

## 2015-09-11 DIAGNOSIS — N179 Acute kidney failure, unspecified: Secondary | ICD-10-CM | POA: Diagnosis present

## 2015-09-11 DIAGNOSIS — C787 Secondary malignant neoplasm of liver and intrahepatic bile duct: Secondary | ICD-10-CM | POA: Diagnosis not present

## 2015-09-11 DIAGNOSIS — R531 Weakness: Secondary | ICD-10-CM | POA: Diagnosis present

## 2015-09-11 DIAGNOSIS — Z515 Encounter for palliative care: Secondary | ICD-10-CM | POA: Diagnosis present

## 2015-09-11 DIAGNOSIS — D61810 Antineoplastic chemotherapy induced pancytopenia: Secondary | ICD-10-CM | POA: Diagnosis present

## 2015-09-11 DIAGNOSIS — C250 Malignant neoplasm of head of pancreas: Secondary | ICD-10-CM | POA: Diagnosis not present

## 2015-09-11 DIAGNOSIS — N182 Chronic kidney disease, stage 2 (mild): Secondary | ICD-10-CM | POA: Diagnosis present

## 2015-09-11 DIAGNOSIS — G9340 Encephalopathy, unspecified: Secondary | ICD-10-CM | POA: Diagnosis present

## 2015-09-11 DIAGNOSIS — R197 Diarrhea, unspecified: Secondary | ICD-10-CM | POA: Diagnosis present

## 2015-09-11 DIAGNOSIS — E860 Dehydration: Secondary | ICD-10-CM | POA: Diagnosis not present

## 2015-09-11 DIAGNOSIS — E875 Hyperkalemia: Secondary | ICD-10-CM | POA: Diagnosis present

## 2015-09-11 DIAGNOSIS — R627 Adult failure to thrive: Secondary | ICD-10-CM | POA: Diagnosis not present

## 2015-09-11 DIAGNOSIS — Z79899 Other long term (current) drug therapy: Secondary | ICD-10-CM

## 2015-09-11 DIAGNOSIS — D61818 Other pancytopenia: Secondary | ICD-10-CM | POA: Diagnosis not present

## 2015-09-11 DIAGNOSIS — E876 Hypokalemia: Secondary | ICD-10-CM | POA: Diagnosis not present

## 2015-09-11 DIAGNOSIS — K5900 Constipation, unspecified: Secondary | ICD-10-CM | POA: Diagnosis present

## 2015-09-11 DIAGNOSIS — T451X5A Adverse effect of antineoplastic and immunosuppressive drugs, initial encounter: Secondary | ICD-10-CM | POA: Diagnosis present

## 2015-09-11 DIAGNOSIS — L89301 Pressure ulcer of unspecified buttock, stage 1: Secondary | ICD-10-CM | POA: Diagnosis present

## 2015-09-11 DIAGNOSIS — C772 Secondary and unspecified malignant neoplasm of intra-abdominal lymph nodes: Secondary | ICD-10-CM | POA: Diagnosis present

## 2015-09-11 DIAGNOSIS — I482 Chronic atrial fibrillation: Secondary | ICD-10-CM | POA: Diagnosis not present

## 2015-09-11 DIAGNOSIS — R5381 Other malaise: Secondary | ICD-10-CM

## 2015-09-11 DIAGNOSIS — I4891 Unspecified atrial fibrillation: Secondary | ICD-10-CM | POA: Diagnosis present

## 2015-09-11 DIAGNOSIS — L8990 Pressure ulcer of unspecified site, unspecified stage: Secondary | ICD-10-CM | POA: Insufficient documentation

## 2015-09-11 LAB — CBC WITH DIFFERENTIAL/PLATELET
BASOS ABS: 0 10*3/uL (ref 0.0–0.1)
BASOS PCT: 0 %
EOS PCT: 0 %
Eosinophils Absolute: 0 10*3/uL (ref 0.0–0.7)
HEMATOCRIT: 24.9 % — AB (ref 39.0–52.0)
Hemoglobin: 9.2 g/dL — ABNORMAL LOW (ref 13.0–17.0)
LYMPHS PCT: 19 %
Lymphs Abs: 0.4 10*3/uL — ABNORMAL LOW (ref 0.7–4.0)
MCH: 35.8 pg — AB (ref 26.0–34.0)
MCHC: 36.9 g/dL — ABNORMAL HIGH (ref 30.0–36.0)
MCV: 96.9 fL (ref 78.0–100.0)
MONOS PCT: 3 %
Monocytes Absolute: 0.1 10*3/uL (ref 0.1–1.0)
NEUTROS ABS: 1.4 10*3/uL — AB (ref 1.7–7.7)
Neutrophils Relative %: 78 %
Platelets: 25 10*3/uL — CL (ref 150–400)
RBC: 2.57 MIL/uL — ABNORMAL LOW (ref 4.22–5.81)
RDW: 25.2 % — AB (ref 11.5–15.5)
WBC: 1.9 10*3/uL — ABNORMAL LOW (ref 4.0–10.5)

## 2015-09-11 LAB — COMPREHENSIVE METABOLIC PANEL
ALT: 24 U/L (ref 17–63)
AST: 44 U/L — AB (ref 15–41)
Albumin: 2.5 g/dL — ABNORMAL LOW (ref 3.5–5.0)
Alkaline Phosphatase: 185 U/L — ABNORMAL HIGH (ref 38–126)
Anion gap: 9 (ref 5–15)
BILIRUBIN TOTAL: 3.7 mg/dL — AB (ref 0.3–1.2)
BUN: 72 mg/dL — AB (ref 6–20)
CO2: 14 mmol/L — ABNORMAL LOW (ref 22–32)
CREATININE: 1.9 mg/dL — AB (ref 0.61–1.24)
Calcium: 7.9 mg/dL — ABNORMAL LOW (ref 8.9–10.3)
Chloride: 109 mmol/L (ref 101–111)
GFR calc Af Amer: 40 mL/min — ABNORMAL LOW (ref >60)
GFR, EST NON AFRICAN AMERICAN: 34 mL/min — AB (ref >60)
Glucose, Bld: 101 mg/dL — ABNORMAL HIGH (ref 65–99)
POTASSIUM: 5.7 mmol/L — AB (ref 3.5–5.1)
Sodium: 132 mmol/L — ABNORMAL LOW (ref 135–145)
TOTAL PROTEIN: 5 g/dL — AB (ref 6.5–8.1)

## 2015-09-11 MED ORDER — PROCHLORPERAZINE EDISYLATE 5 MG/ML IJ SOLN: 10.0000 mg | Freq: Four times a day (QID) | INTRAMUSCULAR | Status: DC | PRN

## 2015-09-11 MED ORDER — FLECAINIDE ACETATE 100 MG PO TABS
100.0000 mg | ORAL_TABLET | Freq: Every day | ORAL | Status: DC
  Filled 2015-09-11: qty 1

## 2015-09-11 MED ORDER — SODIUM CHLORIDE 0.9 % IV SOLN
Freq: Once | INTRAVENOUS | Status: AC
  Administered 2015-09-11: 15:00:00 via INTRAVENOUS

## 2015-09-11 MED ORDER — SODIUM CHLORIDE 0.9 % IV BOLUS (SEPSIS)
500.0000 mL | Freq: Once | INTRAVENOUS | Status: AC
  Administered 2015-09-11: 500 mL via INTRAVENOUS

## 2015-09-11 MED ORDER — HYDROCODONE-ACETAMINOPHEN 5-325 MG PO TABS: 1.0000 | ORAL_TABLET | ORAL | Status: DC | PRN

## 2015-09-11 MED ORDER — SODIUM CHLORIDE 0.9 % IV SOLN
1.0000 g | Freq: Once | INTRAVENOUS | Status: AC
  Administered 2015-09-11: 1 g via INTRAVENOUS
  Filled 2015-09-11: qty 10

## 2015-09-11 MED ORDER — SODIUM CHLORIDE 0.9% FLUSH: 3.0000 mL | Freq: Two times a day (BID) | INTRAVENOUS | Status: DC

## 2015-09-11 MED ORDER — SODIUM BICARBONATE 8.4 % IV SOLN
INTRAVENOUS | Status: DC
  Administered 2015-09-11: 20:00:00 via INTRAVENOUS
  Filled 2015-09-11 (×2): qty 150

## 2015-09-11 MED ORDER — ONDANSETRON HCL 4 MG/2ML IJ SOLN: 4.0000 mg | Freq: Four times a day (QID) | INTRAMUSCULAR | Status: DC | PRN

## 2015-09-11 MED ORDER — HYDROMORPHONE HCL 1 MG/ML IJ SOLN
0.5000 mg | INTRAMUSCULAR | Status: DC | PRN
  Administered 2015-09-11: 0.5 mg via INTRAVENOUS
  Filled 2015-09-11: qty 1

## 2015-09-11 MED ORDER — HYDROMORPHONE HCL 1 MG/ML IJ SOLN: 0.5000 mg | INTRAMUSCULAR | Status: DC | PRN

## 2015-09-11 MED ORDER — OXYCODONE HCL 5 MG PO TABS: 5.0000 mg | ORAL_TABLET | ORAL | Status: DC | PRN

## 2015-09-11 MED ORDER — PANCRELIPASE (LIP-PROT-AMYL) 36000-114000 UNITS PO CPEP
2.0000 | ORAL_CAPSULE | Freq: Three times a day (TID) | ORAL | Status: DC
  Filled 2015-09-11 (×4): qty 3

## 2015-09-11 MED ORDER — ENSURE ENLIVE PO LIQD
237.0000 mL | Freq: Three times a day (TID) | ORAL | Status: DC
  Administered 2015-09-11: 237 mL via ORAL

## 2015-09-11 NOTE — Progress Notes (Signed)
1200-Call received from switchboard stating that pt.'s daughter called to inform [facility] that pt was on his way to the ER and would not be in for blood transfusion today.  Dr. Benay Spice notified.

## 2015-09-11 NOTE — H&P (Signed)
History and Physical    Jeremy Moody GMW:[id number] DOB: [birth year] DOA: 09/13/2015  PCP: Joycelyn Man, MD  Patient coming from: Home Out patient specialist: Dr Learta Codding.   Chief Complaint: weakness.   HPI: Jeremy Moody is a 70 y.o. male with medical history significant of Pancreatic cancer stage IV, metastasis to liver, a fib who presents with progressive weakness, FTT, poor oral intake. G+History obtain from wife, patient sleepy after pain medications. Patient had US paracentesis and Blood transfusion at cancer center yesterday 5-23. He was having problems with constipation and emesis day prior to admission. He took stool softener yesterday morning. Patient overnight develops diarrhea, multiples smasl Bowel movement, loose stool. Patient was siting on the toilet, he was not able to stands up and wife call 911 for help.  Patient does report mild abdominal pain.  Patient has had poor oral intake for last 3 days. He has had abdominal pain and early satiety  that improves after paracentesis.   ED Course: Patient was found to have K at 5.7, CO@ at 14, BUN 72, Cr 1.9, ALK phosphatase 185, WBC 1.9, Platelet 25, bilirubin 3.7   Review of Systems: unable to obtain due to patient AMS.   Past Medical History  Diagnosis Date  . Degenerative disc disease   . Atrial fibrillation and flutter ([place])   . Pancreatic cancer ([place])   . Anemia     Past Surgical History  Procedure Laterality Date  . Ablation  08/2005    for A-flutter  . Cervical laminectomy      x3  . Arm wound repair / closure    . Basal cell carcinoma excision    . Portacath placement       reports that he has never smoked. He has never used smokeless tobacco. He reports that he drinks about 3.0 - 4.0 oz of alcohol per week. He reports that he does not use illicit drugs.  No Known Allergies  Family History  Problem Relation Age of Onset  . Colon cancer Neg Hx   . Lung cancer Mother   . Heart attack Father       Prior to Admission medications   Medication Sig Start Date End Date Taking? Authorizing Provider  CREON 36000 units CPEP capsule Take 2-3 capsules by mouth. Take 3 capsules with meals and 2 capsules with snacks daily 08/20/15  Yes Historical Provider, MD  diphenhydramine-acetaminophen (TYLENOL PM) 25-500 MG TABS tablet Take 1 tablet by mouth at bedtime as needed (sleep).    Yes Historical Provider, MD  diphenoxylate-atropine (LOMOTIL) 2.5-0.025 MG tablet TAKE 1-2 TABLETS BY MOUTH AFTER EACH LOOSE STOOL 4 TIMES A DAY AS NEEDED 08/02/15  Yes Ladell Pier, MD  flecainide (TAMBOCOR) 100 MG tablet Take 1 tablet (100 mg total) by mouth 2 (two) times daily. Patient taking differently: Take 100 mg by mouth daily.  05/09/15  Yes Evans Lance, MD  HYDROcodone-acetaminophen (NORCO/VICODIN) 5-325 MG tablet Take 1 tablet by mouth every 4 (four) hours as needed for moderate pain. 09/03/15  Yes Ladell Pier, MD  ibuprofen (ADVIL,MOTRIN) 200 MG tablet Take 400 mg by mouth every 8 (eight) hours as needed for moderate pain.    Yes Historical Provider, MD  lidocaine-prilocaine (EMLA) cream Place small amount over port area 1-2 hours prior to treatment and cover with plastic wrap.  DO NOT RUB IN 05/02/15  Yes Ladell Pier, MD  LORazepam (ATIVAN) 0.5 MG tablet Take 1 tablet (0.5 mg total) by mouth every  6 (six) hours as needed for anxiety. 09/10/15  Yes Ladell Pier, MD  prochlorperazine (COMPAZINE) 10 MG tablet Take 1 tablet (10 mg total) by mouth every 6 (six) hours as needed for nausea or vomiting. 05/02/15  Yes Ladell Pier, MD  zolpidem (AMBIEN) 5 MG tablet Take 1 tablet (5 mg total) by mouth at bedtime as needed for sleep. 08/13/15  Yes Ladell Pier, MD  metoprolol tartrate (LOPRESSOR) 25 MG tablet Take 1 tablet (25 mg total) by mouth 2 (two) times daily. Patient not taking: Reported on 09/02/2015 05/09/15   Evans Lance, MD  oxyCODONE (OXY IR/ROXICODONE) 5 MG immediate release tablet Take 1-2  tablets (5-10 mg total) by mouth every 4 (four) hours as needed for severe pain. Patient not taking: Reported on 08/28/2015 07/18/15   Ladell Pier, MD    Physical Exam: Filed Vitals:   09/09/2015 1530 09/18/2015 1600 08/22/2015 1630 09/06/2015 1647  BP: 99/54 110/55 103/55   Pulse: 95 96    Temp:    97.5 F (36.4 C)  TempSrc:    Oral  Resp: 7 9 7    SpO2: 100% 100%        Constitutional: NAD, calm, comfortable, cachetic, icteric, chronic ill appearing.  Filed Vitals:   08/20/2015 1530 09/06/2015 1600 09/15/2015 1630 09/06/2015 1647  BP: 99/54 110/55 103/55   Pulse: 95 96    Temp:    97.5 F (36.4 C)  TempSrc:    Oral  Resp: 7 9 7    SpO2: 100% 100%     Eyes: PERRL, lids and conjunctivae normal ENMT: Mucous membranes are dry . Posterior pharynx clear of any exudate or lesions.  Neck: normal, supple, no masses, no thyromegaly Respiratory: clear to auscultation bilaterally, no wheezing, no crackles. Normal respiratory effort. No accessory muscle use.  Cardiovascular: Regular rate and rhythm, no murmurs / rubs / gallops. No extremity edema. 2+ pedal pulses. No carotid bruits.  Abdomen: no tenderness, no masses palpated. . Bowel sounds positive. Distended.  Musculoskeletal: no clubbing / cyanosis. No joint deformity upper and lower extremities.  Skin: no rashes, lesions, ulcers. No induration Neurologic: unable to assess due to sedation Labs on Admission: I have personally reviewed following labs and imaging studies  CBC:  Recent Labs Lab 09/10/15 1135 08/24/2015 1404  WBC 3.0* 1.9*  NEUTROABS 2.3 1.4*  HGB 8.5* 9.2*  HCT 23.6* 24.9*  MCV 103.5* 96.9  PLT 46* 25*   Basic Metabolic Panel:  Recent Labs Lab 09/10/15 1135 09/10/2015 1404  NA 130* 132*  K 5.2* 5.7*  CL  --  109  CO2 15* 14*  GLUCOSE 111 101*  BUN 61.3* 72*  CREATININE 1.6* 1.90*  CALCIUM 8.3* 7.9*   GFR: Estimated Creatinine Clearance: 38.5 mL/min (by C-G formula based on Cr of 1.9). Liver Function  Tests:  Recent Labs Lab 09/10/15 1135 09/17/2015 1404  AST 33 44*  ALT 23 24  ALKPHOS 249* 185*  BILITOT 2.53* 3.7*  PROT 5.0* 5.0*  ALBUMIN 2.3* 2.5*   No results for input(s): LIPASE, AMYLASE in the last 168 hours. No results for input(s): AMMONIA in the last 168 hours. Coagulation Profile: No results for input(s): INR, PROTIME in the last 168 hours. Cardiac Enzymes: No results for input(s): CKTOTAL, CKMB, CKMBINDEX, TROPONINI in the last 168 hours. BNP (last 3 results) No results for input(s): PROBNP in the last 8760 hours. HbA1C: No results for input(s): HGBA1C in the last 72 hours. CBG: No results for input(s): GLUCAP  in the last 168 hours. Lipid Profile: No results for input(s): CHOL, HDL, LDLCALC, TRIG, CHOLHDL, LDLDIRECT in the last 72 hours. Thyroid Function Tests: No results for input(s): TSH, T4TOTAL, FREET4, T3FREE, THYROIDAB in the last 72 hours. Anemia Panel: No results for input(s): VITAMINB12, FOLATE, FERRITIN, TIBC, IRON, RETICCTPCT in the last 72 hours. Urine analysis:    Component Value Date/Time   COLORURINE ORANGE* 04/29/2015 0948   APPEARANCEUR CLOUDY* 04/29/2015 0948   LABSPEC 1.020 07/02/2015 1032   LABSPEC 1.028 04/29/2015 0948   PHURINE 5.0 07/02/2015 1032   PHURINE 5.5 04/29/2015 0948   GLUCOSEU Negative 07/02/2015 [place] 04/29/2015 0948   HGBUR Negative 07/02/2015 1032   HGBUR NEGATIVE 04/29/2015 0948   BILIRUBINUR Unable to determine 07/02/2015 1032   BILIRUBINUR MODERATE* 04/29/2015 0948   KETONESUR Negative 07/02/2015 1032   KETONESUR 15* 04/29/2015 0948   PROTEINUR Negative 07/02/2015 1032   PROTEINUR 30* 04/29/2015 0948   UROBILINOGEN 0.2 07/02/2015 1032   NITRITE Negative 07/02/2015 1032   NITRITE POSITIVE* 04/29/2015 0948   LEUKOCYTESUR Negative 07/02/2015 1032   LEUKOCYTESUR SMALL* 04/29/2015 0948   Sepsis Labs: !!!!!!!!!!!!!!!!!!!!!!!!!!!!!!!!!!!!!!!!!!!! @LABRCNTIP (procalcitonin:4,lacticidven:4) ) Recent  Results (from the past 240 hour(s))  TECHNOLOGIST REVIEW     Status: None   Collection Time: 09/03/15 10:34 AM  Result Value Ref Range Status   Technologist Review 4% metamyelocytes, 2% myelocytes  Final     Radiological Exams on Admission: US Paracentesis  09/10/2015  INDICATION: History pancreatic cancer with significant metastatic disease. He presents today for therapeutic paracentesis. EXAM: ULTRASOUND GUIDED THERAPEUTIC PARACENTESIS MEDICATIONS: 1% lidocaine COMPLICATIONS: None immediate. PROCEDURE: Informed written consent was obtained from the patient after a discussion of the risks, benefits and alternatives to treatment. A timeout was performed prior to the initiation of the procedure. Initial ultrasound scanning demonstrates a moderate amount of ascites within the left lower abdominal quadrant. The left lower abdomen was prepped and draped in the usual sterile fashion. 1% lidocaine was used for local anesthesia. Following this, a 19 gauge, 7-cm, Yueh catheter was introduced. An ultrasound image was saved for documentation purposes. The paracentesis was performed. The catheter was removed and a dressing was applied. The patient tolerated the procedure well without immediate post procedural complication. FINDINGS: A total of approximately 4 L of cloudy yellow fluid was removed. IMPRESSION: Successful ultrasound-guided paracentesis yielding 4 liters of peritoneal fluid. Read by: Saverio Danker, PA-C Electronically Signed   By: Corrie Mckusick D.O.   On: 09/10/2015 15:25    EKG: Independently reviewed. Sinus rhythm, RBBB  Assessment/Plan Principal Problem:   Metabolic acidosis Active Problems:   Atrial fibrillation (HCC)   Pancreas cancer (HCC)   Failure to thrive in adult   Pancytopenia (HCC)   Hyperkalemia  1-Metabolic acidosis; Might be multifactorial, related to renal failure, diarrhea.  Will start IV bicarb Gtt.  2-Hyperkalemia:  Patient had multiple episodes of diarrhea. Would  avoid at this time kayexalate. Also patient decline yesterday KUB unclear if obstruction.  IV bicarb Gtt, should help with hyperkalemia.  Calcium gluconate ordered.  Repeat labs tonight. Marland Kitchen   3-FTT; likely related to underline malignancy  Check UA,. Dr Learta Codding will see patient in consultation.   4-Metastasis Pancreatic cancer stage IV;  Patient with poor functional status. He was referred to palliative care outpatient.  Dr Benay Spice will see patient in consultation tonight.   5-Pancytopenia;  Related to recent chemo.  Monitor. No evidence of active bleeding.  Dr Benay Spice consulted.   6-Transaminases, hyperbilirubinemia; in setting  of metastasis diseases. Repeat labs in am.   7-Abdominal pain, likely related to underline malignancy. Patient was having problems with constipation. Now presents with diarrhea. Will defer further imaging to Dr Benay Spice.   8-AKI on CKD stage II; cr baseline 1.8--1.9.  IV fluids.  Has elevated BUN at 72.  9-Hypotension; has chronic hypotension. Continue with IV fluids.   Discussed care with Dr Benay Spice, patient and family have opted with Hospice care. Will continue with IV fluids overnight and referral for hospice. Plan for residential hospice.   DVT prophylaxis: no anticoagulation due to Thrombocytopenia.  Code Status: DNR, discussed with wife.  Family Communication: care discussed with wife.  Disposition Plan: admit for renal failure, metabolic acidosis.  Consults called: Dr Learta Codding Admission status: Inpatient, telemetry.    Elmarie Shiley MD [facility] Pager [phone]  If 7PM-7AM, please contact night-coverage [email or web address] Password [facility]  09/04/2015, 5:12 PM

## 2015-09-11 NOTE — ED Notes (Signed)
Patient here with complaints of weakness and lethargy. Reports stage 4 pancreatic cancer. Scheduled to have fluid removed from abd today.

## 2015-09-11 NOTE — ED Notes (Signed)
Bed: [id number] Expected date:  Expected time:  Means of arrival:  Comments: EMS- 53s M, [place] Pt

## 2015-09-11 NOTE — ED Provider Notes (Signed)
CSN: [id number]     Arrival date & time 09/11/2015  1232 History   First MD Initiated Contact with Patient 09/11/15 1305     Chief Complaint  Patient presents with  . Weakness  . Fatigue     (Consider location/radiation/quality/duration/timing/severity/associated sxs/prior Treatment) HPI Comments: 70 year old male with history of pancreatic cancer undergoing treatment with last chemotherapy dose 8 days ago presents for weakness. The patient has not been feeling well over the last few days. He has been extremely weak at home. He was very hard to get off the toilet today and so his wife called EMS. No fevers. No increased abdominal pain although he has chronic abdominal pain. He saw his oncologist office yesterday for this same issue and was sent for an ultrasound-guided paracentesis of his abdomen and was transfused 1 unit of PRBCs. He is continued to decline at home. They did consult palliative care outpatient to help with his treatment. He has not been eating or drinking at home per his wife. Initially yesterday was constipated but since that time has developed diarrhea and has been on the toilet for hours according to his wife at home.   Past Medical History  Diagnosis Date  . Degenerative disc disease   . Atrial fibrillation and flutter ([place])   . Pancreatic cancer ([place])   . Anemia    Past Surgical History  Procedure Laterality Date  . Ablation  08/2005    for A-flutter  . Cervical laminectomy      x3  . Arm wound repair / closure    . Basal cell carcinoma excision    . Portacath placement     Family History  Problem Relation Age of Onset  . Colon cancer Neg Hx   . Lung cancer Mother   . Heart attack Father    Social History  Substance Use Topics  . Smoking status: Never Smoker   . Smokeless tobacco: Never Used  . Alcohol Use: 3.0 - 4.0 oz/week    6-8 Standard drinks or equivalent per week    Review of Systems  Constitutional: Positive for fatigue. Negative for fever.   HENT: Negative for congestion.   Eyes: Negative for visual disturbance.  Respiratory: Negative for cough and shortness of breath.   Cardiovascular: Positive for leg swelling. Negative for chest pain.  Gastrointestinal: Positive for abdominal pain (chronic) and diarrhea. Negative for nausea and vomiting.  Genitourinary: Negative for urgency and hematuria.  Musculoskeletal: Negative for myalgias and back pain.  Skin: Negative for wound.  Neurological: Positive for weakness (generalized). Negative for headaches.  Hematological: Bruises/bleeds easily.      Allergies  Review of patient's allergies indicates no known allergies.  Home Medications   Prior to Admission medications   Medication Sig Start Date End Date Taking? Authorizing Provider  CREON 36000 units CPEP capsule Take 2-3 capsules by mouth. Take 3 capsules with meals and 2 capsules with snacks daily 08/20/15  Yes Historical Provider, MD  diphenhydramine-acetaminophen (TYLENOL PM) 25-500 MG TABS tablet Take 1 tablet by mouth at bedtime as needed (sleep).    Yes Historical Provider, MD  diphenoxylate-atropine (LOMOTIL) 2.5-0.025 MG tablet TAKE 1-2 TABLETS BY MOUTH AFTER EACH LOOSE STOOL 4 TIMES A DAY AS NEEDED 08/02/15  Yes Ladell Pier, MD  flecainide (TAMBOCOR) 100 MG tablet Take 1 tablet (100 mg total) by mouth 2 (two) times daily. Patient taking differently: Take 100 mg by mouth daily.  05/09/15  Yes Evans Lance, MD  HYDROcodone-acetaminophen (NORCO/VICODIN) 5-325 MG  tablet Take 1 tablet by mouth every 4 (four) hours as needed for moderate pain. 09/03/15  Yes Ladell Pier, MD  ibuprofen (ADVIL,MOTRIN) 200 MG tablet Take 400 mg by mouth every 8 (eight) hours as needed for moderate pain.    Yes Historical Provider, MD  lidocaine-prilocaine (EMLA) cream Place small amount over port area 1-2 hours prior to treatment and cover with plastic wrap.  DO NOT RUB IN 05/02/15  Yes Ladell Pier, MD  LORazepam (ATIVAN) 0.5 MG tablet  Take 1 tablet (0.5 mg total) by mouth every 6 (six) hours as needed for anxiety. 09/10/15  Yes Ladell Pier, MD  prochlorperazine (COMPAZINE) 10 MG tablet Take 1 tablet (10 mg total) by mouth every 6 (six) hours as needed for nausea or vomiting. 05/02/15  Yes Ladell Pier, MD  zolpidem (AMBIEN) 5 MG tablet Take 1 tablet (5 mg total) by mouth at bedtime as needed for sleep. 08/13/15  Yes Ladell Pier, MD  metoprolol tartrate (LOPRESSOR) 25 MG tablet Take 1 tablet (25 mg total) by mouth 2 (two) times daily. Patient not taking: Reported on 08/25/2015 05/09/15   Evans Lance, MD  oxyCODONE (OXY IR/ROXICODONE) 5 MG immediate release tablet Take 1-2 tablets (5-10 mg total) by mouth every 4 (four) hours as needed for severe pain. Patient not taking: Reported on 09/03/2015 07/18/15   Ladell Pier, MD   BP 99/54 mmHg  Pulse 102  Temp(Src) 97.5 F (36.4 C) (Oral)  Resp 18  SpO2 100% Physical Exam  Constitutional: He is oriented to person, place, and time. He appears cachectic. He appears ill.  HENT:  Head: Normocephalic and atraumatic.  Eyes: EOM are normal. Pupils are equal, round, and reactive to light.  Neck: Normal range of motion. Neck supple.  Cardiovascular: Regular rhythm.  Tachycardia present.   Pulmonary/Chest: Breath sounds normal. He has no wheezes. He has no rales.  Abdominal: Soft. He exhibits distension (mild). There is tenderness (mild).  Musculoskeletal: He exhibits edema.  Neurological: He is oriented to person, place, and time. He exhibits normal muscle tone.  Skin: Skin is warm and dry. No rash noted.  Vitals reviewed.   ED Course  Procedures (including critical care time) Labs Review Labs Reviewed  CBC WITH DIFFERENTIAL/PLATELET  COMPREHENSIVE METABOLIC PANEL  URINALYSIS, ROUTINE W REFLEX MICROSCOPIC (NOT AT [facility])  TYPE AND SCREEN    Imaging Review US Paracentesis  09/10/2015  INDICATION: History pancreatic cancer with significant metastatic disease. He  presents today for therapeutic paracentesis. EXAM: ULTRASOUND GUIDED THERAPEUTIC PARACENTESIS MEDICATIONS: 1% lidocaine COMPLICATIONS: None immediate. PROCEDURE: Informed written consent was obtained from the patient after a discussion of the risks, benefits and alternatives to treatment. A timeout was performed prior to the initiation of the procedure. Initial ultrasound scanning demonstrates a moderate amount of ascites within the left lower abdominal quadrant. The left lower abdomen was prepped and draped in the usual sterile fashion. 1% lidocaine was used for local anesthesia. Following this, a 19 gauge, 7-cm, Yueh catheter was introduced. An ultrasound image was saved for documentation purposes. The paracentesis was performed. The catheter was removed and a dressing was applied. The patient tolerated the procedure well without immediate post procedural complication. FINDINGS: A total of approximately 4 L of cloudy yellow fluid was removed. IMPRESSION: Successful ultrasound-guided paracentesis yielding 4 liters of peritoneal fluid. Read by: Saverio Danker, PA-C Electronically Signed   By: Corrie Mckusick D.O.   On: 09/10/2015 15:25   I have personally reviewed and  evaluated these images and lab results as part of my medical decision-making.   EKG Interpretation None      MDM  Patient was seen and evaluated at bedside with wife at bedside. Patient appears extremely ill chronically. Notes from oncology visit yesterday and laboratory studies from yesterday reviewed. Labs today with leukopenia, anemia, thrombocytopenia. Hemoglobin improved from yesterday but other cell lines decreased. Creatinine up from 1.6 yesterday to 1.9. BUN 72 today up from in the 60s yesterday. Patient was given IV fluids in the emergency department. He continued to feel very weak. I discussed all of his results and clinical impression over the phone with Dr. Benay Spice his oncologist. He said if the patient was too weak to go home that  he could be brought into the hospital. The plan was for the patient to be transitioned to palliative care/hospice this week. Dr. Benay Spice said he would hold on a transfusion that was planned today for PRBCs. He said he would see the patient in the morning and help to coordinate with hospice and address any treatment for his platelets or anemia. I discussed with patient at bedside his results and Dr. Gearldine Shown recommendations. The patient was very weak and lethargic. He said that he did feel he needed to stay in the hospital. Tried hospitalists were consultative who agreed with admission. Patient was admitted to the telemetry floor under their care with Dr. Benay Spice to follow in consult. Final diagnoses:  None    1. Debility  2. Hyperkalemia  3. Dehydration  4. Failure to thrive    Harvel Quale, MD 09/09/2015 [phone]

## 2015-09-11 NOTE — Progress Notes (Signed)
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 He was seen at the [facility] yesterday. He received a unit of packed red blood cells and underwent a therapeutic paracentesis. He was referred to home palliative care.  He presents emergency room today with progressive weakness. He had loose stools at home. He is admitted for further evaluation.  Objective: Vital signs in last 24 hours: Blood pressure 87/44, pulse 100, temperature 97.8 F (36.6 C), temperature source Oral, resp. rate 7, height 5\' 11"  (1.803 m), SpO2 100 %.  Intake/Output from previous day:    Physical Exam:  HEENT: The mouth is dry, no thrush or bleeding Lungs: Clear bilaterally, no respiratory distress Cardiac: Regular rate and rhythm Abdomen: Distended with ascites, nontender, no hepatomegaly Extremities: No leg edema Neurologic: Lethargic, arousable, follows commands, answers questions appropriately  Portacath/PICC-without erythema  Lab Results:  Recent Labs  09/10/15 1135 08/27/2015 1404  WBC 3.0* 1.9*  HGB 8.5* 9.2*  HCT 23.6* 24.9*  PLT 46* 25*    BMET  Recent Labs  09/10/15 1135 09/10/2015 1404  NA 130* 132*  K 5.2* 5.7*  CL  --  109  CO2 15* 14*  GLUCOSE 111 101*  BUN 61.3* 72*  CREATININE 1.6* 1.90*  CALCIUM 8.3* 7.9*    Studies/Results: US Paracentesis  09/10/2015  INDICATION: History pancreatic cancer with significant metastatic disease. He presents today for therapeutic paracentesis. EXAM: ULTRASOUND GUIDED THERAPEUTIC PARACENTESIS MEDICATIONS: 1% lidocaine COMPLICATIONS: None immediate. PROCEDURE: Informed written consent was obtained from the patient after a discussion of the risks, benefits and alternatives to treatment. A timeout was performed prior to the initiation of the procedure. Initial ultrasound scanning demonstrates a moderate amount of ascites within the left lower abdominal quadrant. The left lower abdomen was  prepped and draped in the usual sterile fashion. 1% lidocaine was used for local anesthesia. Following this, a 19 gauge, 7-cm, Yueh catheter was introduced. An ultrasound image was saved for documentation purposes. The paracentesis was performed. The catheter was removed and a dressing was applied. The patient tolerated the procedure well without immediate post procedural complication. FINDINGS: A total of approximately 4 L of cloudy yellow fluid was removed. IMPRESSION: Successful ultrasound-guided paracentesis yielding 4 liters of peritoneal fluid. Read by: Saverio Danker, PA-C Electronically Signed   By: Corrie Mckusick D.O.   On: 09/10/2015 15:25    Medications: I have reviewed the patient's current medications.  Assessment/Plan:  1. Pancreas cancer, stage IV  CT abdomen/pelvis 04/29/2015 revealed a pancreas uncinate mass, liver metastases, lung nodules, extensive retroperitoneal/mesenteric, pelvic, and low mediastinal lymphadenopathy  Ultrasound-guided biopsy of a right liver lesion 05/02/2015 confirmed metastatic adenocarcinoma  Cycle 1 gemcitabine/Abraxane 05/07/2015  Cycle 2 gemcitabine/Abraxane 05/21/2015 (50% dose reduction)  Cycle 3 gemcitabine/Abraxane 06/05/2015  Cycle 4 gemcitabine/Abraxane 06/18/2015  Cycle 5 gemcitabine/Abraxane 07/09/2015  Cycle 6 gemcitabine/Abraxane 07/23/2015  CT abdomen/pelvis 08/08/2015-lung nodules are smaller, liver lesions are smaller, pancreas mass and portacaval lymph node are smaller, increased ascites  Cycle 7 gemcitabine/Abraxane 08/13/2015  Cycle 8 gemcitabine/Abraxane 09/03/2015  2. Anorexia/weight loss  3. Abdominal/back pain secondary to the pancreas mass and liver metastases-improved  4. History of atrial fibrillation and flutter  5. [place] likely secondary to pancreatic insufficiency, he continues pancreatic enzyme replacement  6. Oral candidiasis 05/03/2015-treated with Diflucan; recurrent oral candidiasis  05/28/2015. Treated with Diflucan.  7. Hyperbilirubinemia.   CT abdomen/pelvis 05/14/2015 with no biliary duct dilatation. Mild lateral segment left liver lobe intrahepatic  duct dilatation similar and likely secondary to a central left liver lobe metastasis. Decreased size of pancreatic uncinate process primary. Mild improvement in hepatic metastasis. Similar abdominal nodal metastasis. Porta hepatis node larger. Likely smimlar pulmonary metastasis. Increase in small volume of abdominal ascites.   8. Hypotension. Metoprolol adjusted to 12.5 mg twice daily 05/21/2015. Metoprolol discontinued 06/25/2015.  9. Ascites-status post repeat therapeutic paracentesis procedures, cytology negative on 07/02/2015, 07/19/2015, 07/25/2015, and 08/01/2015  10. Anemia secondary to chronic disease and chemotherapy-status post red cell transfusions 06/24/2015 ,07/18/2015, and 09/10/2015  11. 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 He has clinical evidence of dehydration with hyperkalemia. I suspect he has intravascular volume depletion following multiple paracentesis procedures and limited oral intake.  The underlying problem is incurable metastatic pancreas cancer. The pancreas cancer has most likely progressed. I discussed Hospice care with Dr. Redmond Pulling and his wife. He agrees to a [facility] referral. He does not wish to undergo further procedures.  His wife and family will discuss home hospice versus [employer]. His wife understands that his lifespan may be limited to hours or days.     LOS: 0 days   Betsy Coder, MD   09/14/2015, 5:30 PM

## 2015-09-11 NOTE — ED Notes (Signed)
Patient aware we need urine, will let us know when he is able to give a specimen

## 2015-09-12 ENCOUNTER — Encounter: Payer: Self-pay | Admitting: *Deleted

## 2015-09-12 ENCOUNTER — Other Ambulatory Visit (HOSPITAL_COMMUNITY): Payer: 59

## 2015-09-12 DIAGNOSIS — L8990 Pressure ulcer of unspecified site, unspecified stage: Secondary | ICD-10-CM | POA: Insufficient documentation

## 2015-09-12 DIAGNOSIS — R627 Adult failure to thrive: Secondary | ICD-10-CM

## 2015-09-12 DIAGNOSIS — I482 Chronic atrial fibrillation: Secondary | ICD-10-CM

## 2015-09-12 DIAGNOSIS — D61818 Other pancytopenia: Secondary | ICD-10-CM

## 2015-09-12 LAB — DIFFERENTIAL
BAND NEUTROPHILS: 0 %
BASOS ABS: 0 10*3/uL (ref 0.0–0.1)
Basophils Relative: 0 %
Blasts: 0 %
EOS ABS: 0 10*3/uL (ref 0.0–0.7)
Eosinophils Relative: 0 %
LYMPHS ABS: 0.1 10*3/uL — AB (ref 0.7–4.0)
Lymphocytes Relative: 13 %
METAMYELOCYTES PCT: 0 %
MONO ABS: 0.1 10*3/uL (ref 0.1–1.0)
MONOS PCT: 7 %
MYELOCYTES: 0 %
NEUTROS ABS: 0.6 10*3/uL — AB (ref 1.7–7.7)
NEUTROS PCT: 80 %
OTHER: 0 %
Promyelocytes Absolute: 0 %
nRBC: 0 /100 WBC

## 2015-09-12 LAB — CBC
HEMATOCRIT: 21.9 % — AB (ref 39.0–52.0)
HEMOGLOBIN: 8.2 g/dL — AB (ref 13.0–17.0)
MCH: 36.1 pg — AB (ref 26.0–34.0)
MCHC: 37.4 g/dL — AB (ref 30.0–36.0)
MCV: 96.5 fL (ref 78.0–100.0)
Platelets: 19 10*3/uL — CL (ref 150–400)
RBC: 2.27 MIL/uL — AB (ref 4.22–5.81)
RDW: 25.4 % — ABNORMAL HIGH (ref 11.5–15.5)
WBC: 0.8 10*3/uL — CL (ref 4.0–10.5)

## 2015-09-12 LAB — COMPREHENSIVE METABOLIC PANEL
ALT: 21 U/L (ref 17–63)
ANION GAP: 9 (ref 5–15)
AST: 40 U/L (ref 15–41)
Albumin: 2 g/dL — ABNORMAL LOW (ref 3.5–5.0)
Alkaline Phosphatase: 168 U/L — ABNORMAL HIGH (ref 38–126)
BUN: 74 mg/dL — ABNORMAL HIGH (ref 6–20)
CHLORIDE: 108 mmol/L (ref 101–111)
CO2: 16 mmol/L — AB (ref 22–32)
Calcium: 7.5 mg/dL — ABNORMAL LOW (ref 8.9–10.3)
Creatinine, Ser: 1.85 mg/dL — ABNORMAL HIGH (ref 0.61–1.24)
GFR, EST AFRICAN AMERICAN: 41 mL/min — AB (ref >60)
GFR, EST NON AFRICAN AMERICAN: 35 mL/min — AB (ref >60)
GLUCOSE: 107 mg/dL — AB (ref 65–99)
POTASSIUM: 5.2 mmol/L — AB (ref 3.5–5.1)
SODIUM: 133 mmol/L — AB (ref 135–145)
Total Bilirubin: 2.8 mg/dL — ABNORMAL HIGH (ref 0.3–1.2)
Total Protein: 4.4 g/dL — ABNORMAL LOW (ref 6.5–8.1)

## 2015-09-12 MED ORDER — HYDROMORPHONE HCL 1 MG/ML IJ SOLN: 0.5000 mg | INTRAMUSCULAR | Status: DC | PRN

## 2015-09-12 MED ORDER — MORPHINE SULFATE 2 MG/ML IJ SOLN: 2.0000 mg | INTRAMUSCULAR | Status: DC | PRN

## 2015-09-12 MED ORDER — SODIUM CHLORIDE 0.9% FLUSH: 10.0000 mL | INTRAVENOUS | Status: DC | PRN

## 2015-09-12 MED ORDER — MORPHINE SULFATE (PF) 2 MG/ML IV SOLN
2.0000 mg | INTRAVENOUS | Status: DC | PRN
  Administered 2015-09-12: 2 mg via INTRAVENOUS
  Filled 2015-09-12: qty 1

## 2015-09-13 ENCOUNTER — Other Ambulatory Visit: Payer: 59

## 2015-09-13 ENCOUNTER — Ambulatory Visit: Payer: 59 | Admitting: Nurse Practitioner

## 2015-09-14 LAB — TYPE AND SCREEN
ABO/RH(D): O POS
Antibody Screen: NEGATIVE
UNIT DIVISION: 0
Unit division: 0

## 2015-09-18 ENCOUNTER — Ambulatory Visit: Payer: 59

## 2015-09-18 ENCOUNTER — Ambulatory Visit (HOSPITAL_COMMUNITY): Payer: 59

## 2015-09-19 NOTE — Progress Notes (Signed)
Patient passed away and confirmed at 20:35 by Carolyn Stare, RN and [place], RN. Family at patient's bedside which included wife, two daughters, and son.  On call for Dr Tyrell Antonio was notified as well as Dr Burr Medico, on call for Dr. Benay Spice.

## 2015-09-19 NOTE — Consult Note (Signed)
[facility] [employer]  Received request from Dr. Benay Spice to meet with family to explain hospice services. Made RNCM Cookie and CSW Claiborne Billings aware. Chart reviewed and met with spouse at bedside this morning to explain home hospice and [facility]. Made follow-up visit later this afternoon and met with son and daughter. Family working hard to determine best plan at the time and questioning possible hospital death. Spoke with bedside RN who made me aware of changes since this morning. [facility] RN Liaison will follow up in the morning.   Thank you.  Erling Conte, [place]

## 2015-09-19 NOTE — Progress Notes (Signed)
Private duty list and [place] list given to the daughter at pt's bedside.

## 2015-09-19 NOTE — Progress Notes (Signed)
PROGRESS NOTE    Jeremy Moody  [id number] DOB: [birth year] DOA: 09/13/2015 PCP: Joycelyn Man, MD  Primary Oncologist ; Dr Benay Spice.    Brief Narrative: Jeremy Moody is a 70 y.o. male with medical history significant of Pancreatic cancer stage IV, metastasis to liver, a fib who presents with progressive weakness, FTT, poor oral intake. History obtain from wife, patient sleepy after pain medications. Patient had US paracentesis and Blood transfusion at cancer center  5-23. He was having problems with constipation and emesis day prior to admission. He took stool softener morning prior to admission . Patient overnight develops diarrhea, multiples smasl Bowel movement, loose stool. Patient was siting on the toilet, he was not able to stands up and wife call 911 for help. Patient does report mild abdominal pain.   Patient admitted with progressive decline and FTT due to underline malignancy. Dr Ammie Dalton discussed with family (son and wife) plan is for comfort care. Residential hospice referral made. Family to decide on Home with hospice or residential hospice.    Assessment & Plan:   Principal Problem:   Metabolic acidosis Active Problems:   Atrial fibrillation (HCC)   Pancreas cancer (HCC)   Failure to thrive in adult   Pancytopenia (HCC)   Hyperkalemia   Pressure ulcer  1-Metabolic acidosis; Might be multifactorial, related to renal failure, diarrhea.  Continue with  IV bicarb Gtt, stop at time of transfer   2-Hyperkalemia:  IV bicarb Gtt, should help with hyperkalemia.  K trending down.   3-FTT; likely related to underline malignancy  Dr Learta Codding discussed with family,plan is for comfort care.   4-Metastasis Pancreatic cancer stage IV;  Patient with poor functional status. He was referred to palliative care outpatient.  Now comfort care.   5-Pancytopenia;  Related to recent chemo.  Monitor. No evidence of active bleeding.  Dr Benay Spice recommend platelet  transfusion in the event of bleeding.   6-Transaminases, hyperbilirubinemia; in setting of metastasis diseases.   7-Abdominal pain, likely related to underline malignancy. Patient was having problems with constipation. Now presents with diarrhea. Dilaudid PRN for pain.   8-AKI on CKD stage II; cr baseline 1.8--1.9. IV fluids. no significant improvement.  Has elevated BUN at 72.  9-Hypotension; has chronic hypotension. Continue with IV fluids. He has been afebrile. If spike fever plan is to start antibiotics.  10-Encephalopathy; multifactorial, hepatic. Wont be able to tolerate lactulose, he has not been able to take oral medications. Also renal failure, malignancy  11-A fib; on flecainide.    DVT prophylaxis: no anticoagulation due to thrombocytopenia.  Code Status: DNR>  Family Communication: daughter at bedside.  Disposition Plan: residential hospice vs home with hospice.    Consultants:   Dr Benay Spice.   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 He doesn't appear to be in any pain.  Daughter was at bedside. He hasn't say anything to her.  Per nurse, no BM today  Per nurse patient does not follow command to swallow medications.   Objective: Filed Vitals:   09/07/2015 1730 08/29/2015 2042 11-Oct-2015 0529 10/11/15 1300  BP:  98/45 99/53 100/38  Pulse:  110 108 119  Temp:  97.9 F (36.6 C) 98.2 F (36.8 C) 99.3 F (37.4 C)  TempSrc:  Oral Oral Oral  Resp:  18 18 26   Height:      Weight: 75.66 kg (166 lb 12.8 oz)  SpO2:  100% 97% 96%    Intake/Output Summary (Last 24 hours) at 10-12-2015 1506 Last data filed at 10/12/15 1500  Gross per 24 hour  Intake 2112.5 ml  Output      0 ml  Net 2112.5 ml   Filed Weights   09/18/2015 1730  Weight: 75.66 kg (166 lb 12.8 oz)    Examination:  General exam: Appears calm and comfortable  Respiratory system: Clear to auscultation.  Respiratory effort normal. Cardiovascular system: S1 & S2 heard, RRR. No JVD, murmurs, rubs, gallops or clicks. No pedal edema. Gastrointestinal system: Abdomen is nondistended, soft and nontender. No organomegaly or masses felt. Normal bowel sounds heard. Central nervous system: alert, not responsive.  Extremities: no edema Skin: No rashes, lesions or ulcers    Data Reviewed: I have personally reviewed following labs and imaging studies  CBC:  Recent Labs Lab 09/10/15 1135 09/08/2015 1404 Oct 12, 2015 0445  WBC 3.0* 1.9* 0.8*  NEUTROABS 2.3 1.4* 0.6*  HGB 8.5* 9.2* 8.2*  HCT 23.6* 24.9* 21.9*  MCV 103.5* 96.9 96.5  PLT 46* 25* 19*   Basic Metabolic Panel:  Recent Labs Lab 09/10/15 1135 09/01/2015 1404 10-12-2015 0445  NA 130* 132* 133*  K 5.2* 5.7* 5.2*  CL  --  109 108  CO2 15* 14* 16*  GLUCOSE 111 101* 107*  BUN 61.3* 72* 74*  CREATININE 1.6* 1.90* 1.85*  CALCIUM 8.3* 7.9* 7.5*   GFR: Estimated Creatinine Clearance: 39.6 mL/min (by C-G formula based on Cr of 1.85). Liver Function Tests:  Recent Labs Lab 09/10/15 1135 09/18/2015 1404 2015/10/12 0445  AST 33 44* 40  ALT 23 24 21   ALKPHOS 249* 185* 168*  BILITOT 2.53* 3.7* 2.8*  PROT 5.0* 5.0* 4.4*  ALBUMIN 2.3* 2.5* 2.0*   No results for input(s): LIPASE, AMYLASE in the last 168 hours. No results for input(s): AMMONIA in the last 168 hours. Coagulation Profile: No results for input(s): INR, PROTIME in the last 168 hours. Cardiac Enzymes: No results for input(s): CKTOTAL, CKMB, CKMBINDEX, TROPONINI in the last 168 hours. BNP (last 3 results) No results for input(s): PROBNP in the last 8760 hours. HbA1C: No results for input(s): HGBA1C in the last 72 hours. CBG: No results for input(s): GLUCAP in the last 168 hours. Lipid Profile: No results for input(s): CHOL, HDL, LDLCALC, TRIG, CHOLHDL, LDLDIRECT in the last 72 hours. Thyroid Function Tests: No results for input(s): TSH, T4TOTAL, FREET4, T3FREE, THYROIDAB  in the last 72 hours. Anemia Panel: No results for input(s): VITAMINB12, FOLATE, FERRITIN, TIBC, IRON, RETICCTPCT in the last 72 hours. Sepsis Labs: No results for input(s): PROCALCITON, LATICACIDVEN in the last 168 hours.  Recent Results (from the past 240 hour(s))  TECHNOLOGIST REVIEW     Status: None   Collection Time: 09/03/15 10:34 AM  Result Value Ref Range Status   Technologist Review 4% metamyelocytes, 2% myelocytes  Final         Radiology Studies: US Paracentesis  09/10/2015  INDICATION: History pancreatic cancer with significant metastatic disease. He presents today for therapeutic paracentesis. EXAM: ULTRASOUND GUIDED THERAPEUTIC PARACENTESIS MEDICATIONS: 1% lidocaine COMPLICATIONS: None immediate. PROCEDURE: Informed written consent was obtained from the patient after a discussion of the risks, benefits and alternatives to treatment. A timeout was performed prior to the initiation of the procedure. Initial ultrasound scanning demonstrates a moderate amount of ascites within the left lower abdominal quadrant. The left lower abdomen was prepped and draped in the usual sterile fashion. 1% lidocaine was used for local  anesthesia. Following this, a 19 gauge, 7-cm, Yueh catheter was introduced. An ultrasound image was saved for documentation purposes. The paracentesis was performed. The catheter was removed and a dressing was applied. The patient tolerated the procedure well without immediate post procedural complication. FINDINGS: A total of approximately 4 L of cloudy yellow fluid was removed. IMPRESSION: Successful ultrasound-guided paracentesis yielding 4 liters of peritoneal fluid. Read by: Saverio Danker, PA-C Electronically Signed   By: Corrie Mckusick D.O.   On: 09/10/2015 15:25        Scheduled Meds: . feeding supplement (ENSURE ENLIVE)  237 mL Oral TID BM  . flecainide  100 mg Oral Daily  . lipase/protease/amylase  2-3 capsule Oral TID WC  . sodium chloride flush  3 mL  Intravenous Q12H   Continuous Infusions: .  sodium bicarbonate  infusion 1000 mL 75 mL/hr at 09/17/2015 2009     LOS: 1 day    Time spent: 35 minutes.     Elmarie Shiley, MD [facility] Pager [phone]  If 7PM-7AM, please contact night-coverage [email or web address] Password [email or web address] 09-17-15, 3:06 PM

## 2015-09-19 NOTE — Progress Notes (Signed)
IP PROGRESS NOTE  Subjective:   Jeremy Moody appears comfortable. His wife and son are at the bedside.  Objective: Vital signs in last 24 hours: Blood pressure 99/53, pulse 108, temperature 98.2 F (36.8 C), temperature source Oral, resp. rate 18, height 5\' 11"  (1.803 m), weight 166 lb 12.8 oz (75.66 kg), SpO2 97 %.  Intake/Output from previous day: 05/24 0701 - 05/25 0700 In: 1512.5 [I.V.:1402.5; IV Piggyback:110] Out: -   Physical Exam:   Lungs: Moves air bilaterally, no respiratory distress Cardiac: Regular rate and rhythm Abdomen: Distended with ascites, nontender, no hepatomegaly Extremities: No leg edema Neurologic: Opens eyes, not speaking or following commands  Portacath/PICC-without erythema   Lab Results:  Recent Labs  08/31/2015 1404 09/13/15 0445  WBC 1.9* 0.8*  HGB 9.2* 8.2*  HCT 24.9* 21.9*  PLT 25* 19*    BMET  Recent Labs  08/20/2015 1404 2015/09/13 0445  NA 132* 133*  K 5.7* 5.2*  CL 109 108  CO2 14* 16*  GLUCOSE 101* 107*  BUN 72* 74*  CREATININE 1.90* 1.85*  CALCIUM 7.9* 7.5*    Studies/Results: US Paracentesis  09/10/2015  INDICATION: History pancreatic cancer with significant metastatic disease. He presents today for therapeutic paracentesis. EXAM: ULTRASOUND GUIDED THERAPEUTIC PARACENTESIS MEDICATIONS: 1% lidocaine COMPLICATIONS: None immediate. PROCEDURE: Informed written consent was obtained from the patient after a discussion of the risks, benefits and alternatives to treatment. A timeout was performed prior to the initiation of the procedure. Initial ultrasound scanning demonstrates a moderate amount of ascites within the left lower abdominal quadrant. The left lower abdomen was prepped and draped in the usual sterile fashion. 1% lidocaine was used for local anesthesia. Following this, a 19 gauge, 7-cm, Yueh catheter was introduced. An ultrasound image was saved for documentation purposes. The paracentesis was performed. The catheter was  removed and a dressing was applied. The patient tolerated the procedure well without immediate post procedural complication. FINDINGS: A total of approximately 4 L of cloudy yellow fluid was removed. IMPRESSION: Successful ultrasound-guided paracentesis yielding 4 liters of peritoneal fluid. Read by: Saverio Danker, PA-C Electronically Signed   By: Corrie Mckusick D.O.   On: 09/10/2015 15:25    Medications: I have reviewed the patient's current medications.  Assessment/Plan:  1. Pancreas cancer, stage IV  CT abdomen/pelvis 04/29/2015 revealed a pancreas uncinate mass, liver metastases, lung nodules, extensive retroperitoneal/mesenteric, pelvic, and low mediastinal lymphadenopathy  Ultrasound-guided biopsy of a right liver lesion 05/02/2015 confirmed metastatic adenocarcinoma  Cycle 1 gemcitabine/Abraxane 05/07/2015  Cycle 2 gemcitabine/Abraxane 05/21/2015 (50% dose reduction)  Cycle 3 gemcitabine/Abraxane 06/05/2015  Cycle 4 gemcitabine/Abraxane 06/18/2015  Cycle 5 gemcitabine/Abraxane 07/09/2015  Cycle 6 gemcitabine/Abraxane 07/23/2015  CT abdomen/pelvis 08/08/2015-lung nodules are smaller, liver lesions are smaller, pancreas mass and portacaval lymph node are smaller, increased ascites  Cycle 7 gemcitabine/Abraxane 08/13/2015  Cycle 8 gemcitabine/Abraxane 09/03/2015  2. Anorexia/weight loss  3. Abdominal/back pain secondary to the pancreas mass and liver metastases-improved  4. History of atrial fibrillation and flutter  5. [place] likely secondary to pancreatic insufficiency, he continues pancreatic enzyme replacement  6. Oral candidiasis 05/03/2015-treated with Diflucan; recurrent oral candidiasis 05/28/2015. Treated with Diflucan.  7. Hyperbilirubinemia.   CT abdomen/pelvis 05/14/2015 with no biliary duct dilatation. Mild lateral segment left liver lobe intrahepatic duct dilatation similar and likely secondary to a central left liver lobe  metastasis. Decreased size of pancreatic uncinate process primary. Mild improvement in hepatic metastasis. Similar abdominal nodal metastasis. Porta hepatis node larger. Likely smimlar pulmonary metastasis. Increase in small volume  of abdominal ascites.   8. Hypotension. Metoprolol adjusted to 12.5 mg twice daily 05/21/2015. Metoprolol discontinued 06/25/2015.  9. Ascites-status post repeat therapeutic paracentesis procedures, cytology negative on 07/02/2015, 07/19/2015, 07/25/2015, and 08/01/2015  10. Anemia secondary to chronic disease and chemotherapy-status post red cell transfusions 06/24/2015 ,07/18/2015, and 09/10/2015  11. Leukopenia/Thrombocytopenia secondary to chemotherapy   Jeremy Moody is less alert today. His performance status has declined significantly over the past few weeks. I discussed the situation with his wife and son. The plan is for comfort care. He will enroll in the [facility] program.  He has developed significant pancytopenia following the most recent cycle of chemotherapy. I have a low clinical suspicion for a systemic infection. No apparent bleeding. I recommend platelet transfusion support for bleeding and antibiotics if he develops a high fever.  I contacted the hospice coordinator and she will meet with the family this morning. They are deciding on home hospice care versus [facility].    LOS: 1 day   Betsy Coder, MD   10/01/2015, 8:27 AM

## 2015-09-19 NOTE — Progress Notes (Signed)
Chaplain Note:  I spent about thirty minutes with Dr. Redmond Pulling and his family. 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 His included his wife Bethena Roys their son and two daughters. They were grieving and speaking to him expressing their appreciation and affection. At their request I offered a prayer for him and committing him to the care of God. They have my cell phone and will contact me following his death.   Wells Guiles  Director Spiritual Care Service

## 2015-09-19 NOTE — Progress Notes (Signed)
Chaplain paged and then declined. Daytime chaplains have been requested to follow up as appropriate.   Sallee Lange. Amanada Philbrick, [place]

## 2015-09-19 NOTE — Progress Notes (Signed)
Comfort Care Note:   Pt with incurable metastatic pancreas cancer. Pt identified through [facility]. Per physician note, pt declining and the plan is to transition to comfort care. No nutrition interventions warranted at this time. If nutrition issues arise, consult as needed.   Geoffery Lyons, [place] [facility] Dietetic Intern Pager [phone]

## 2015-09-19 NOTE — Progress Notes (Signed)
CRITICAL VALUE ALERT  Critical value received:  White blood cell count 0.8  Date of notification:  09-17-2015  Time of notification:  0610  Critical value read back:Yes.    Nurse who received alert:  Vida Roller, who then informed me  MD notified (1st page):  K. Sofia   Time of first page:  0614  MD notified (2nd page):  Time of second page:  Responding MD:    Time MD responded:

## 2015-09-19 NOTE — Progress Notes (Signed)
Received call from Jeremy Moody with [facility] inquiring if Dr. Benay Spice will be pt.'s attending MD and if OK to activate hospice care orders.  Per Dr. Benay Spice, [place] for orders as previously written.  [facility] called and notified.

## 2015-09-19 NOTE — Discharge Summary (Addendum)
Death Summary  Jeremy Moody [id number] DOB: [birth year] DOA: 10-02-2015  PCP: Joycelyn Man, MD   Admit date: 10-02-15 Date of Death: 10/04/2015  Final Diagnoses:    Pancreas cancer metastatic, Stage IV   Progressive Failure to thrive in adult secondary to malignancy    Acute encephalopathy    AKI, on CKD II   Metabolic acidosis   Atrial fibrillation (HCC   Pancytopenia (HCC)   Hyperkalemia   Pressure ulcer   Brief Narrative: [name] is a 70 y.o. male with medical history significant of Pancreatic cancer stage IV, metastasis to liver, a fib who presents with progressive weakness, FTT, poor oral intake. History obtain from wife, patient sleepy after pain medications. Patient had US paracentesis and Blood transfusion at cancer center 5-23. He was having problems with constipation and emesis day prior to admission. He took stool softener morning prior to admission . Patient overnight develops diarrhea, multiples smasl Bowel movement, loose stool. Patient was siting on the toilet, he was not able to stands up and wife call 911 for help. Patient does report mild abdominal pain.   Patient admitted with progressive decline and FTT due to underline malignancy. Dr Ammie Dalton discussed with family (son and wife) plan is for comfort care. Residential hospice referral made. Family to decide on Home with hospice or residential hospice. Patient received IV fluids, bicarb Gtt, no evidence of infection. Patient continue to be encephalopathy, family discussed with Dr Benay Spice, goals of care was to focus on comfort care..    Assessment & Plan:  Principal Problem:  Metabolic acidosis Active Problems:  Atrial fibrillation (HCC)  Pancreas cancer (HCC)  Failure to thrive in adult  Pancytopenia (HCC)  Hyperkalemia  Pressure ulcer  1-Metabolic acidosis; Might be multifactorial, related to renal failure, diarrhea.  Received  IV bicarb Gtt.   2-Hyperkalemia:  IV bicarb  Gtt, should help with hyperkalemia.  K trending down.   3-FTT; likely related to underline malignancy  Dr Learta Codding discussed with family,plan is for comfort care.   4-Metastasis Pancreatic cancer stage IV;  Patient with poor functional status. He was referred to palliative care outpatient.  Now comfort care.   5-Pancytopenia;  Related to recent chemo.  Monitor. No evidence of active bleeding.  Dr Benay Spice recommend platelet transfusion in the event of bleeding.   6-Transaminases, hyperbilirubinemia; in setting of metastasis diseases.   7-Abdominal pain, likely related to underline malignancy. Patient was having problems with constipation. Now presents with diarrhea. Dilaudid PRN for pain.   8-AKI on CKD stage II; cr baseline 1.8--1.9. IV fluids. no significant improvement.  Has elevated BUN at 72.  9-Hypotension; has chronic hypotension. Continue with IV fluids. He has been afebrile.  10-Encephalopathy; multifactorial, hepatic. Wont be able to tolerate lactulose, he has not been able to take oral medications. Also renal failure, malignancy  11-A fib; on flecainide.  12-Pressure Ulcer stage I buttock. Local care  Time: 20;35 pm  Signed:  Elmarie Shiley  [facility] 10/04/2015, 4:24 PM

## 2015-09-19 NOTE — Progress Notes (Signed)
CRITICAL VALUE ALERT  Critical value received:  Platelets 19  Date of notification:  2015-09-20  Time of notification:  0610  Critical value read back:Yes.    Nurse who received alert:  Vida Roller, who then informed me  MD notified (1st page):  K. Sofia  Time of first page:  0614  MD notified (2nd page):   Time of second page:  Responding MD:    Time MD responded:

## 2015-09-19 DEATH — deceased

## 2015-09-24 ENCOUNTER — Ambulatory Visit: Payer: 59 | Admitting: Oncology

## 2015-09-24 ENCOUNTER — Other Ambulatory Visit: Payer: 59

## 2015-09-24 ENCOUNTER — Ambulatory Visit: Payer: 59

## 2015-09-25 ENCOUNTER — Ambulatory Visit (HOSPITAL_COMMUNITY): Payer: 59

## 2015-09-25 ENCOUNTER — Ambulatory Visit: Payer: 59

## 2015-10-04 ENCOUNTER — Other Ambulatory Visit: Payer: Self-pay | Admitting: Nurse Practitioner

## 2016-03-21 IMAGING — US US PARACENTESIS
1 series · 8 of 8 positions shown · non-contrast
Comparison: none

INDICATION: Pancreatic cancer, ascites. Request made for diagnostic and
therapeutic paracentesis up to 5 liters.

[Series 1: us paracentesis · 0.31mm/px · 8 of 8 slices shown]
[im 1/8]
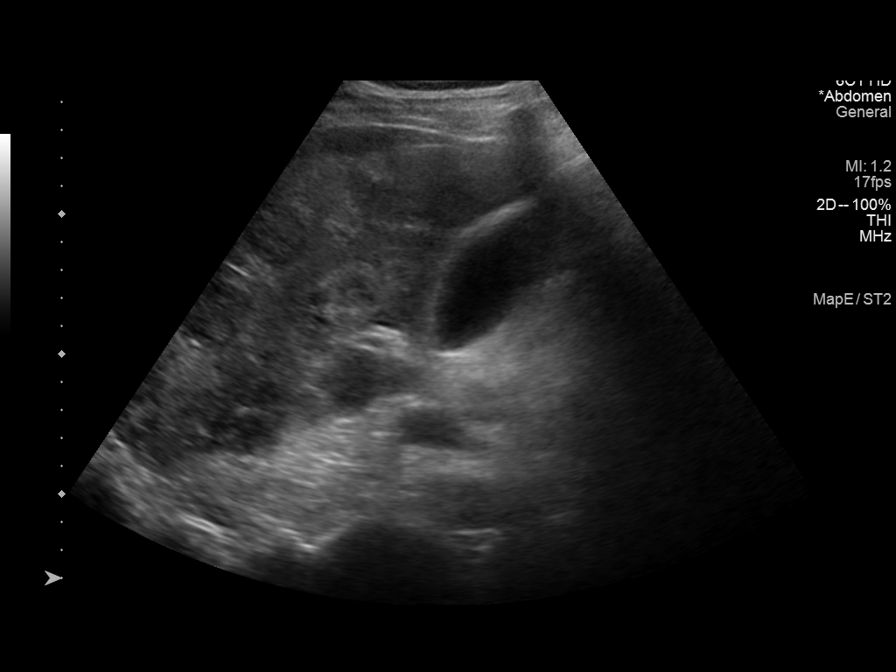
[im 2/8]
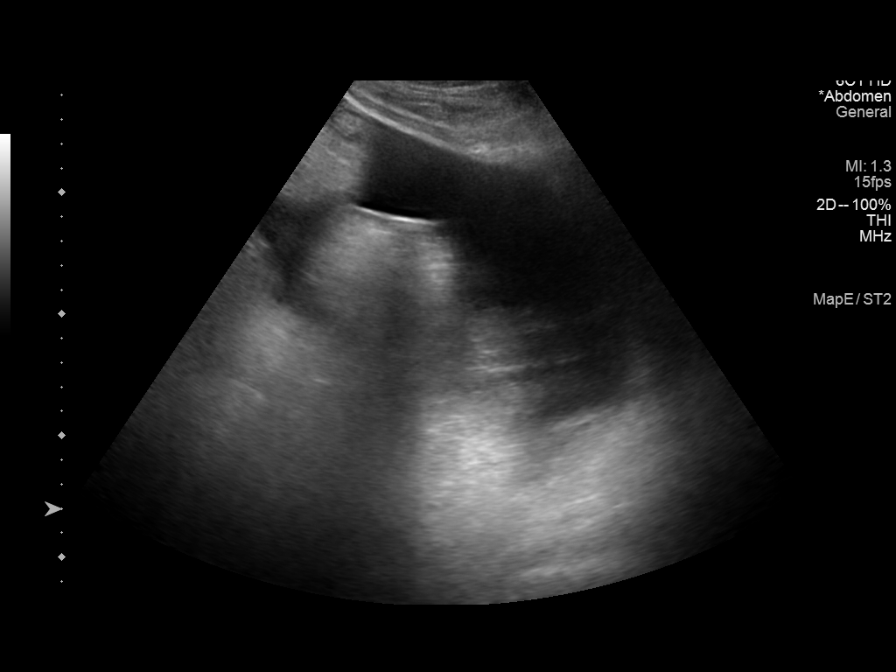
[im 3/8]
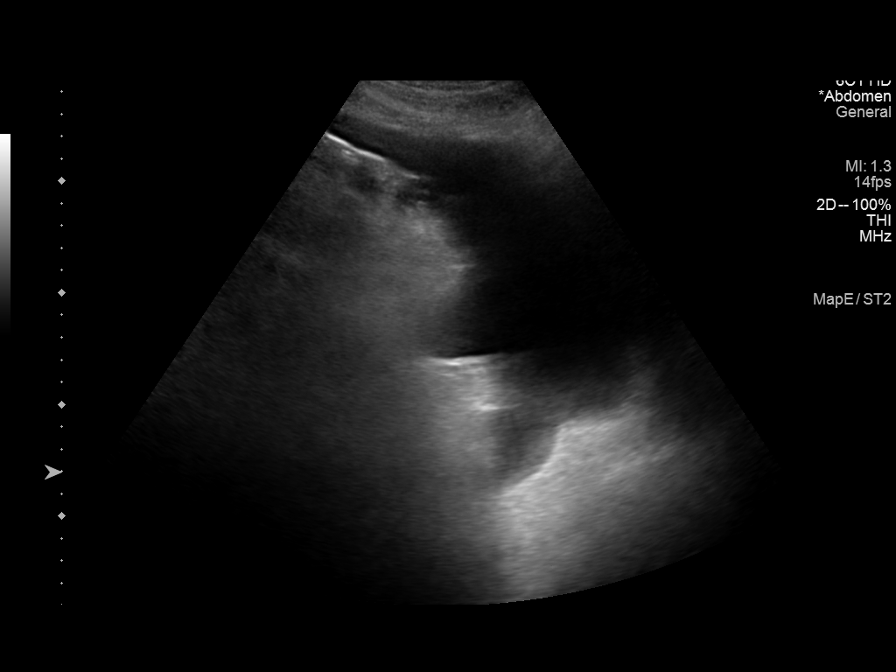
[im 4/8]
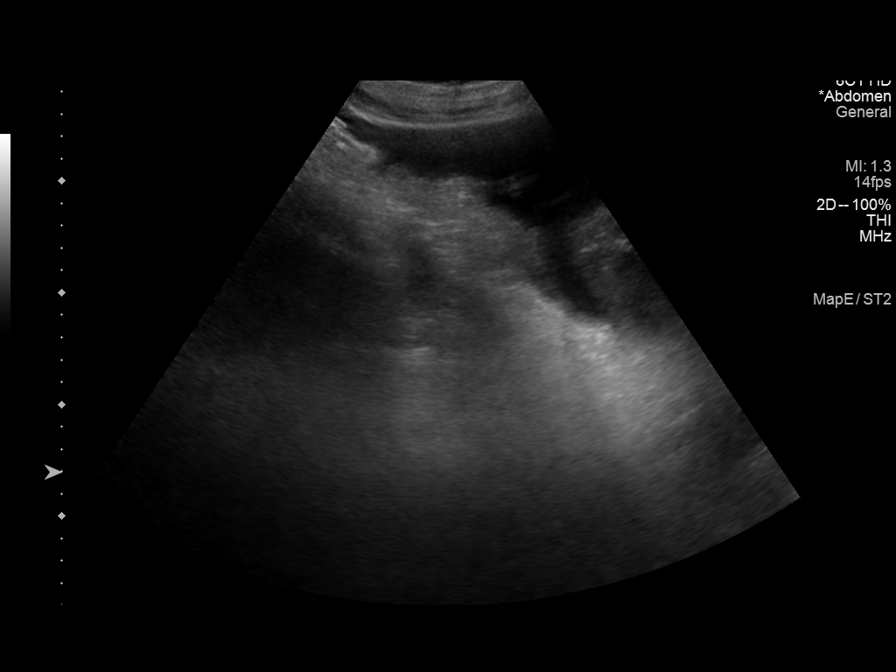
[im 5/8]
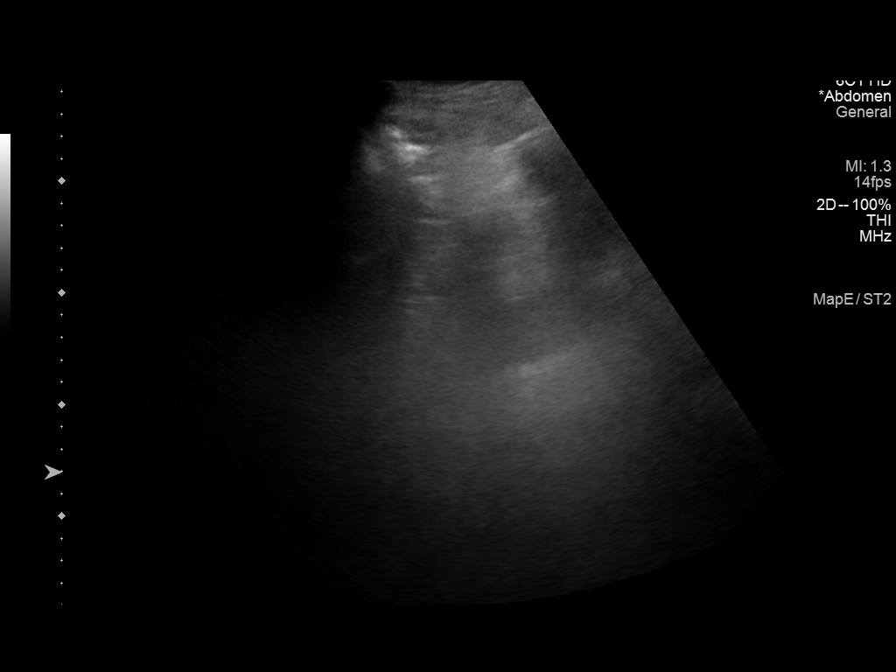
[im 6/8]
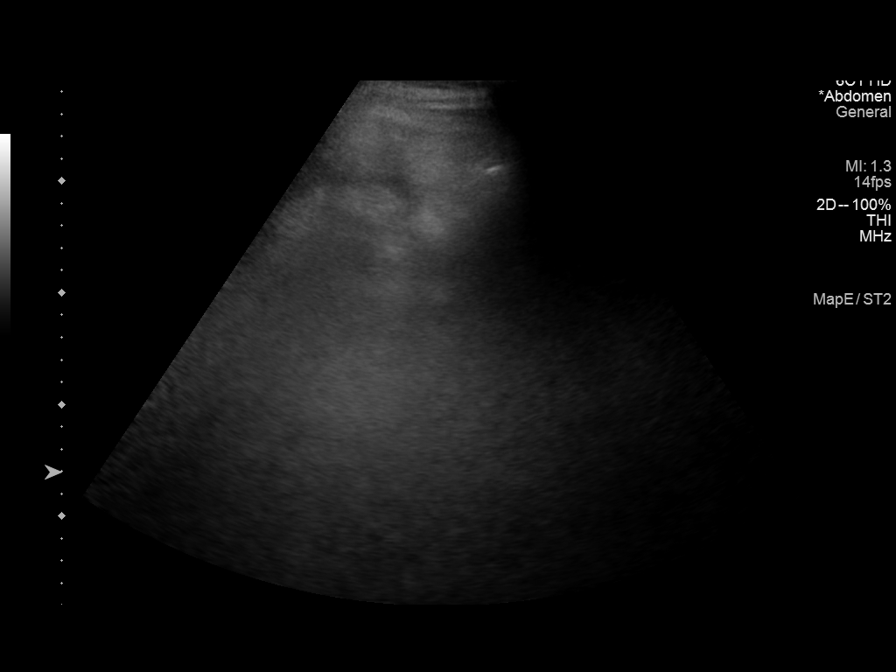
[im 7/8]
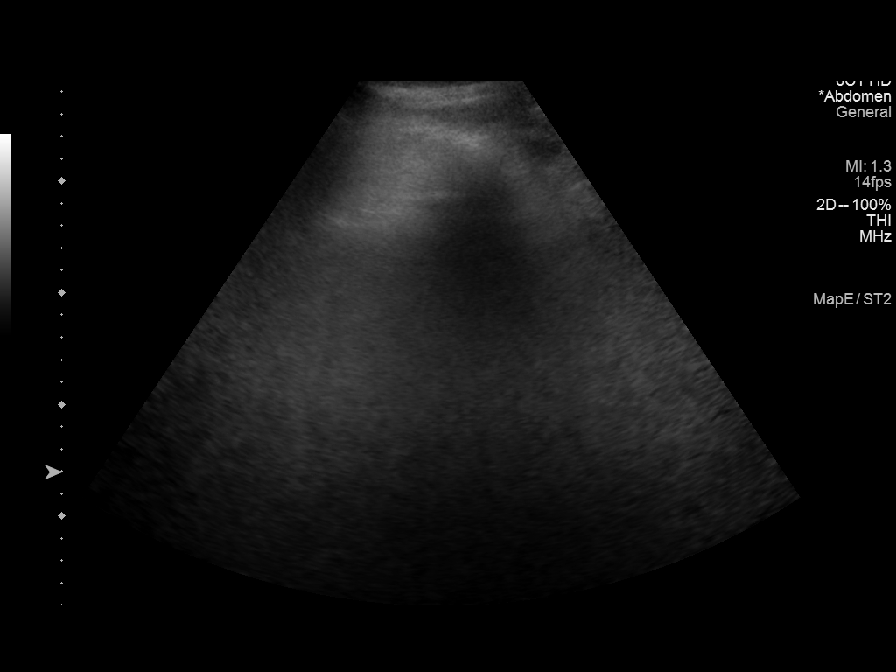
[im 8/8]
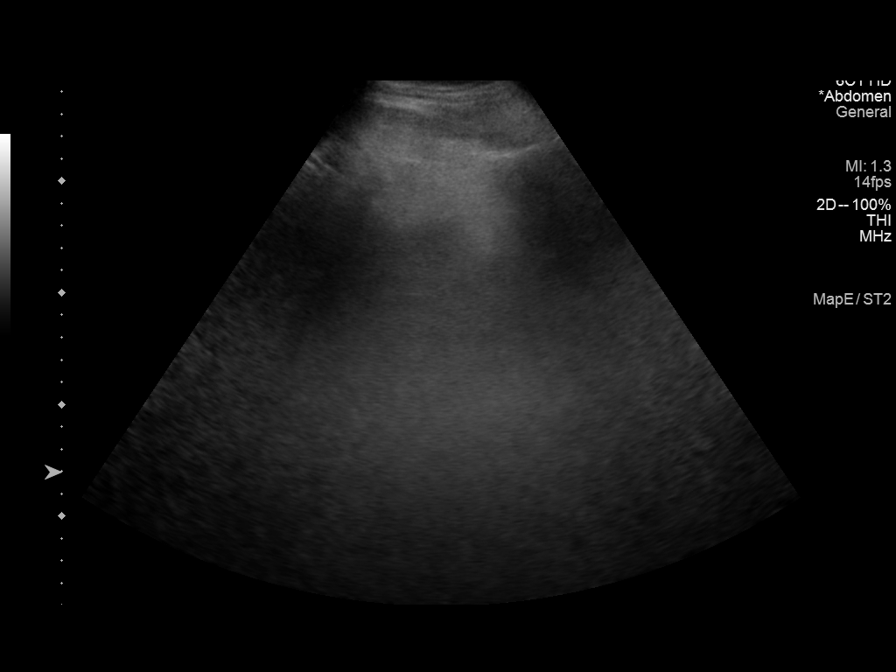

[8 of 8 positions shown; findings below may reference images not displayed]

EXAM:
ULTRASOUND GUIDED THERAPEUTIC PARACENTESIS

MEDICATIONS:
None.

COMPLICATIONS:
None immediate.

PROCEDURE:
Informed written consent was obtained from the patient after a
discussion of the risks, benefits and alternatives to treatment. A
timeout was performed prior to the initiation of the procedure.

Initial ultrasound scanning demonstrates a moderate to large amount
of ascites within the right lower abdominal quadrant. The right
lower abdomen was prepped and draped in the usual sterile fashion.
1% lidocaine was used for local anesthesia.

Following this, a Yueh catheter was introduced. An ultrasound image
was saved for documentation purposes. The paracentesis was
performed. The catheter was removed and a dressing was applied. The
patient tolerated the procedure well without immediate post
procedural complication.
FINDINGS: A total of approximately 4 liters of turbid, yellow fluid was
removed. Samples were sent to the laboratory as requested by the
clinical team.
IMPRESSION: Successful ultrasound-guided therapeutic paracentesis yielding 4
liters of peritoneal fluid.

## 2016-04-26 IMAGING — US US PARACENTESIS
1 series · 4 of 4 positions shown · non-contrast
Comparison: none

INDICATION: Patient with history of stage IV pancreatic cancer, recurrent
ascites. Request is made for therapeutic paracentesis.

[Series 1: us paracentesis · 0.25mm/px · 4 of 4 slices shown]
[im 1/4]
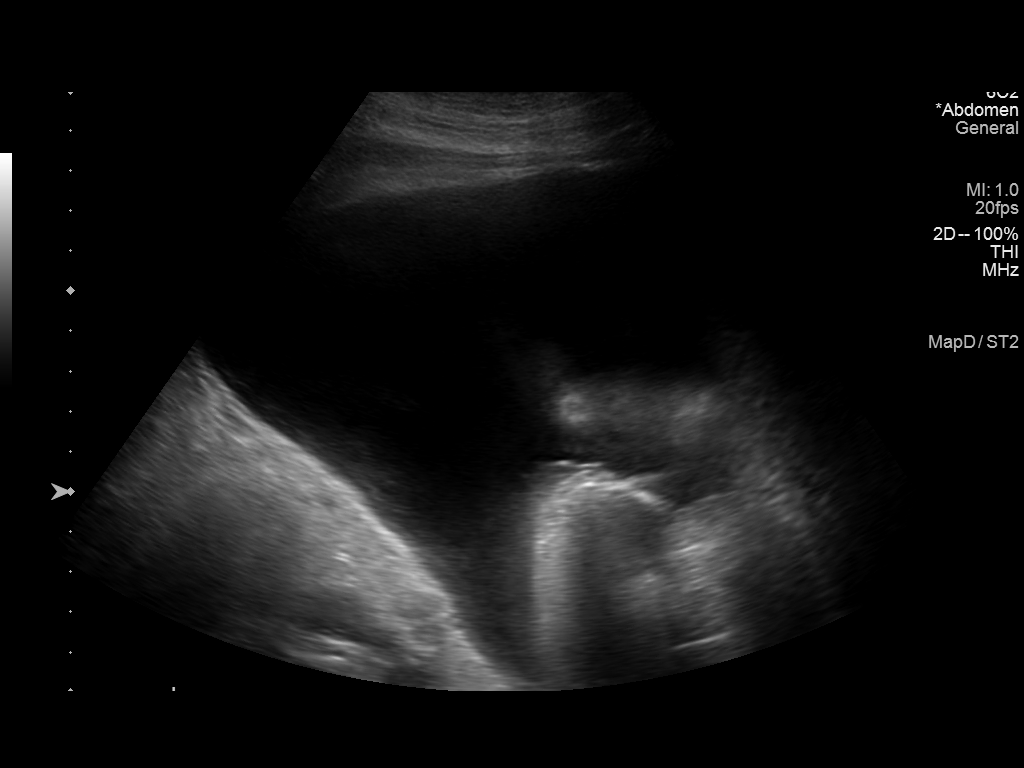
[im 2/4]
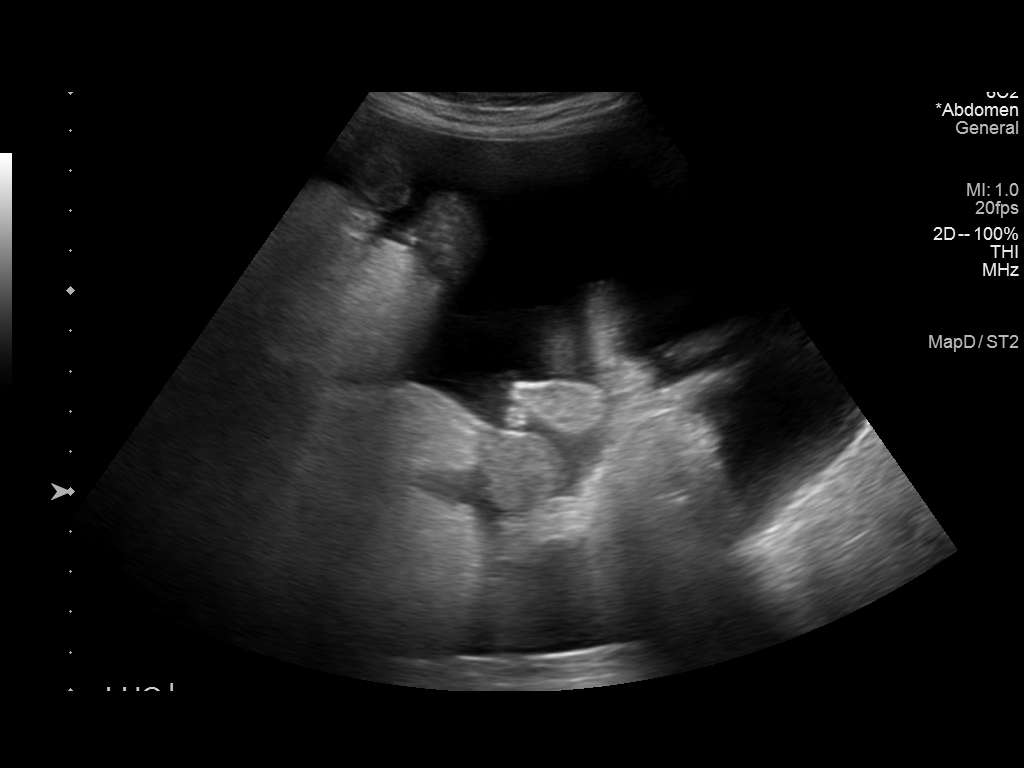
[im 3/4]
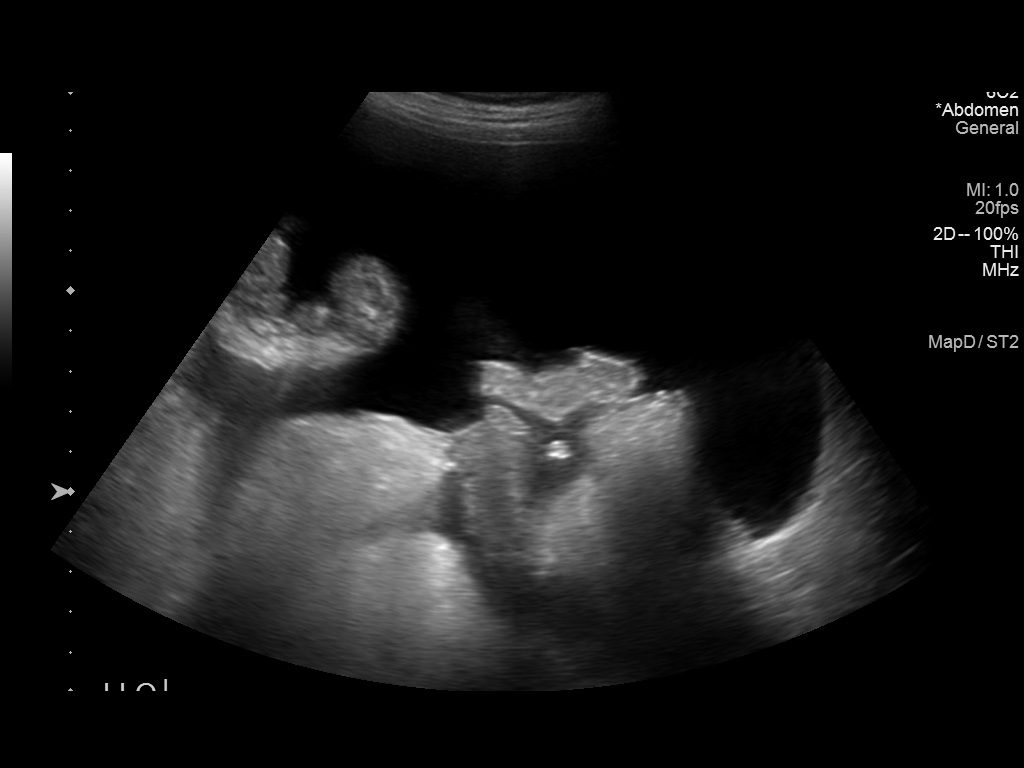
[im 4/4]
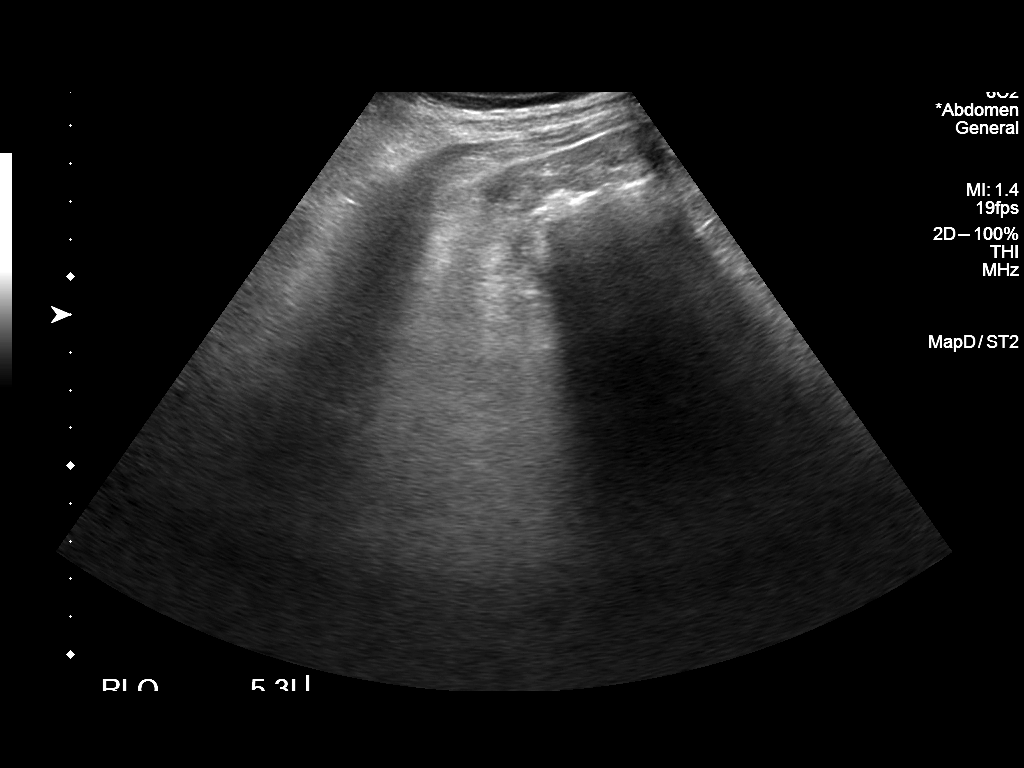

[4 of 4 positions shown; findings below may reference images not displayed]

EXAM:
ULTRASOUND GUIDED THERAPEUTIC PARACENTESIS

MEDICATIONS:
None.

COMPLICATIONS:
None immediate.

PROCEDURE:
Informed written consent was obtained from the patient after a
discussion of the risks, benefits and alternatives to treatment. A
timeout was performed prior to the initiation of the procedure.

Initial ultrasound scanning demonstrates a large amount of ascites
within the right mid to lower abdominal quadrant. The right mid to
lower abdomen was prepped and draped in the usual sterile fashion.
1% lidocaine was used for local anesthesia.

Following this, a Yueh catheter was introduced. An ultrasound image
was saved for documentation purposes. The paracentesis was
performed. The catheter was removed and a dressing was applied. The
patient tolerated the procedure well without immediate post
procedural complication.
FINDINGS: A total of approximately 5.3 liters of turbid, yellow fluid was
removed.
IMPRESSION: Successful ultrasound-guided therapeutic paracentesis yielding
liters of peritoneal fluid.

## 2016-05-03 IMAGING — US US PARACENTESIS
1 series · 6 of 6 positions shown · non-contrast
Comparison: none

INDICATION: 69-year-old male with a known history of pancreatic cancer with
recurrent ascites. Request is made for therapeutic paracentesis.

[Series 1: us paracentesis · 0.26mm/px · 6 of 6 slices shown]
[im 1/6]
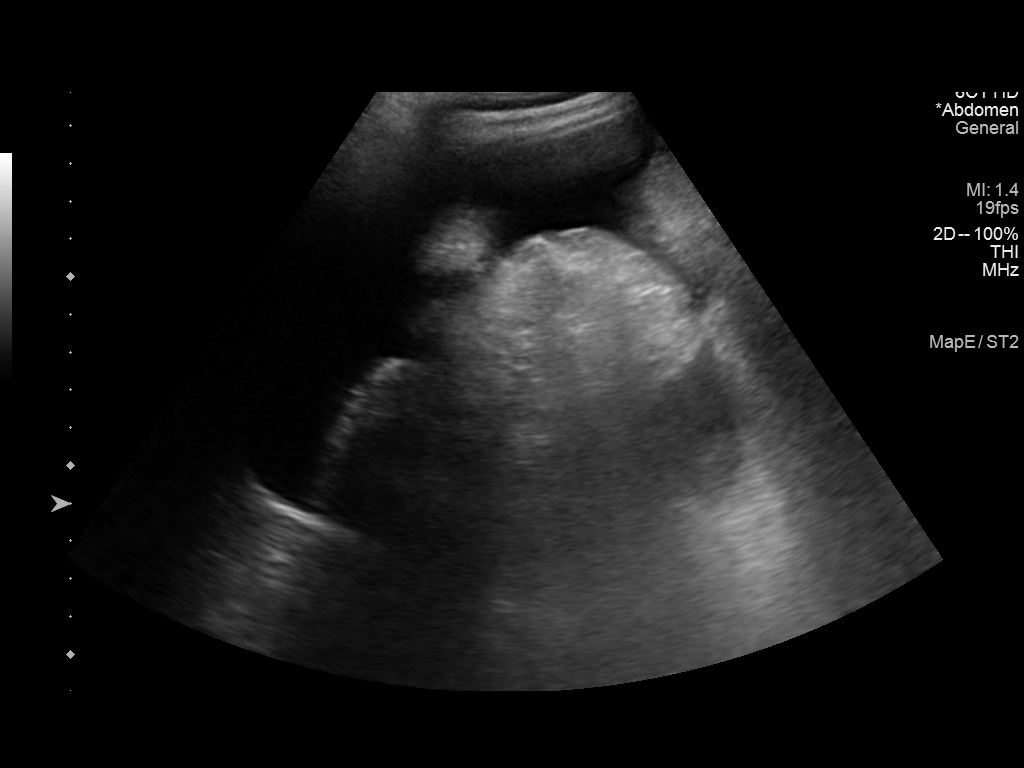
[im 2/6]
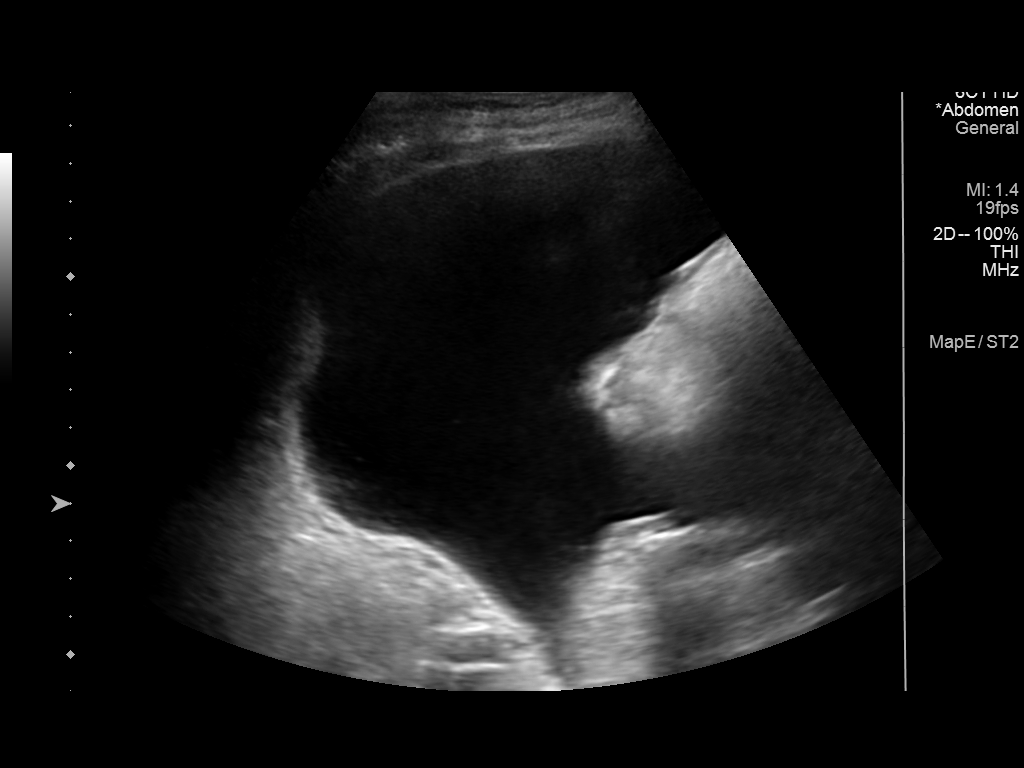
[im 3/6]
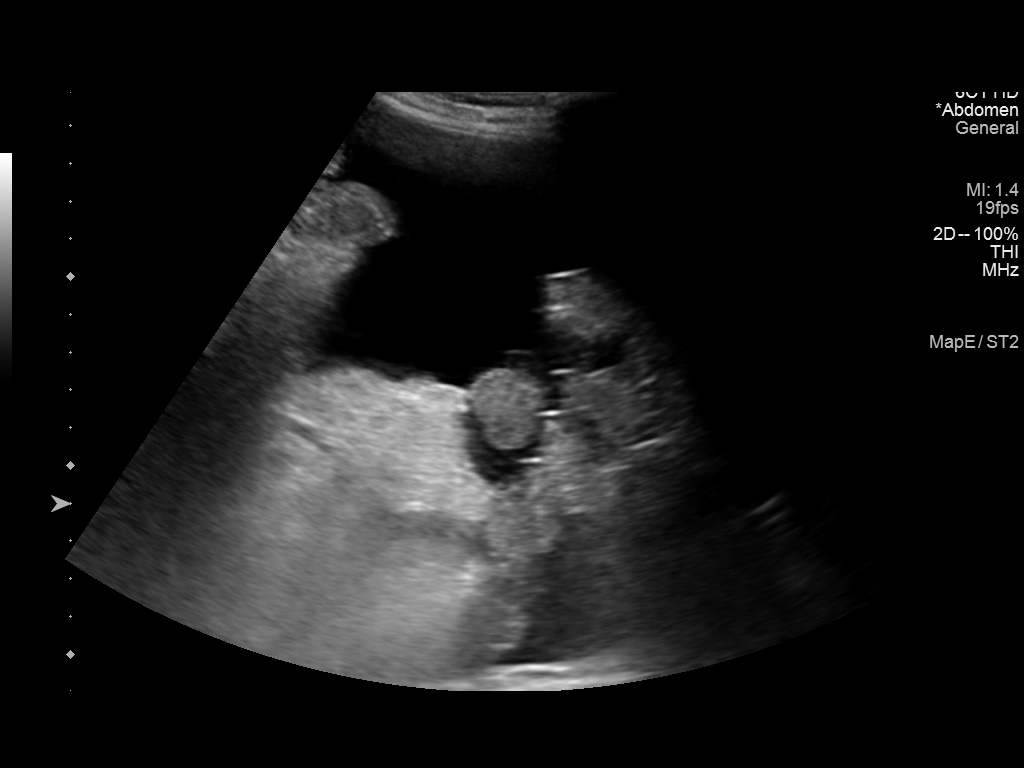
[im 4/6]
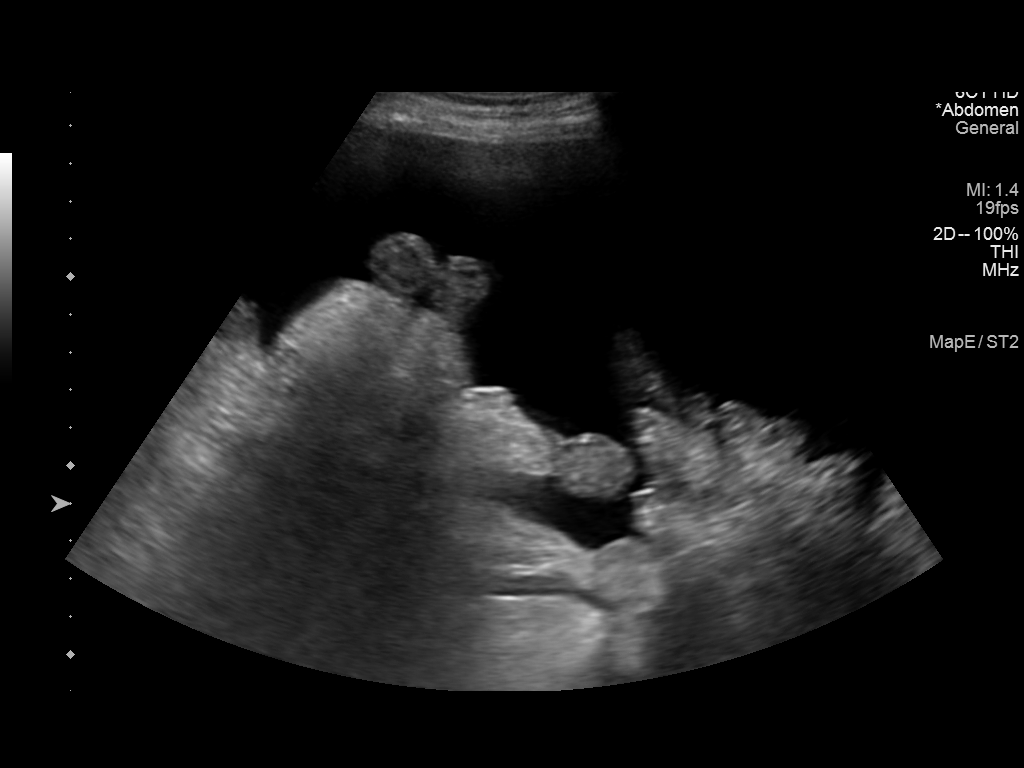
[im 5/6]
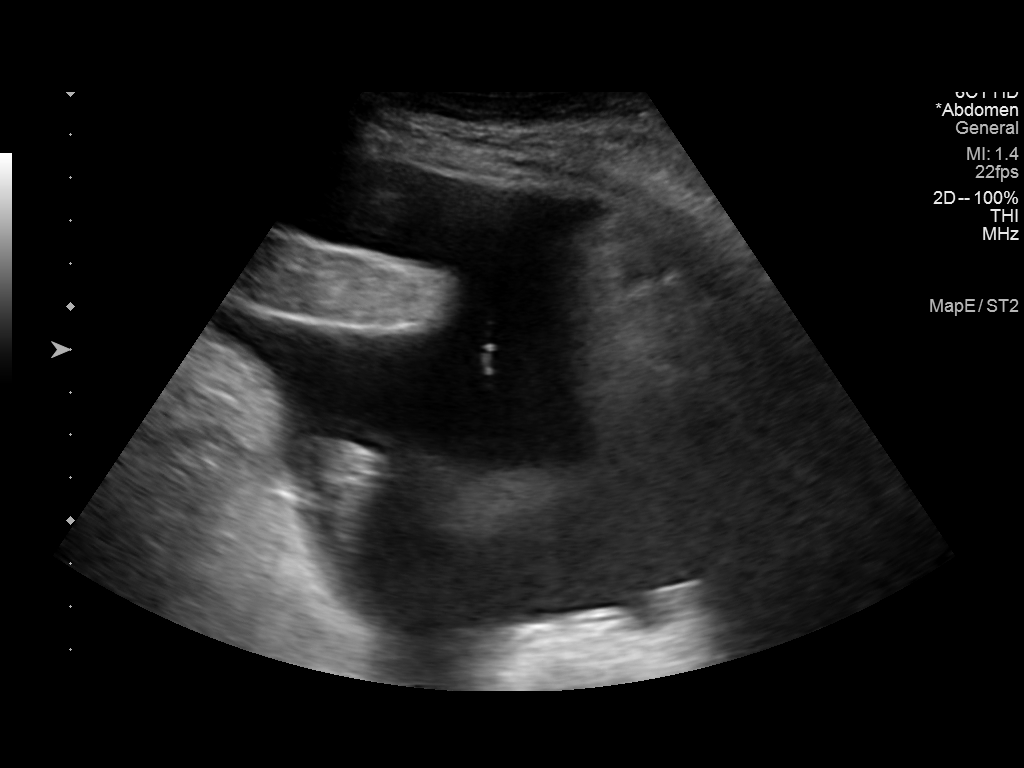
[im 6/6]
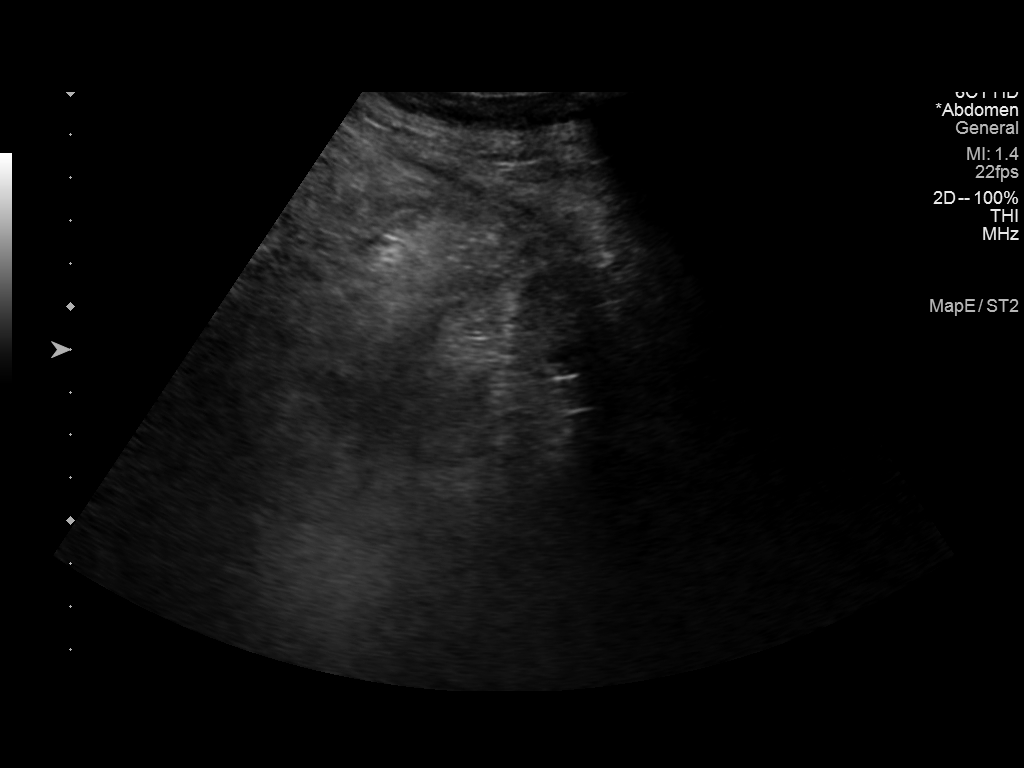

[6 of 6 positions shown; findings below may reference images not displayed]

EXAM:
ULTRASOUND GUIDED THERAPEUTIC PARACENTESIS

MEDICATIONS:
1% lidocaine

COMPLICATIONS:
None immediate.

PROCEDURE:
Informed written consent was obtained from the patient after a
discussion of the risks, benefits and alternatives to treatment. A
timeout was performed prior to the initiation of the procedure.

Initial ultrasound scanning demonstrates a large amount of ascites
within the right lower abdominal quadrant. The right lower abdomen
was prepped and draped in the usual sterile fashion. 1% lidocaine
was used for local anesthesia.

Following this, a 19 gauge, 7-cm, Yueh catheter was introduced. An
ultrasound image was saved for documentation purposes. The
paracentesis was performed. The catheter was removed and a dressing
was applied. The patient tolerated the procedure well without
immediate post procedural complication.
FINDINGS: A total of approximately 5.7 L of slightly cloudy yellow fluid was
removed.
IMPRESSION: Successful ultrasound-guided paracentesis yielding 5.7 liters of
peritoneal fluid.

## 2016-05-18 IMAGING — US US PARACENTESIS
1 series · 5 of 5 positions shown · non-contrast
Comparison: none

INDICATION: Metastatic pancreatic cancer. Recurrent ascites. Request for
therapeutic paracentesis of up to 6 L max.

[Series 1: us paracentesis · 0.26mm/px · 5 of 5 slices shown]
[im 1/5]
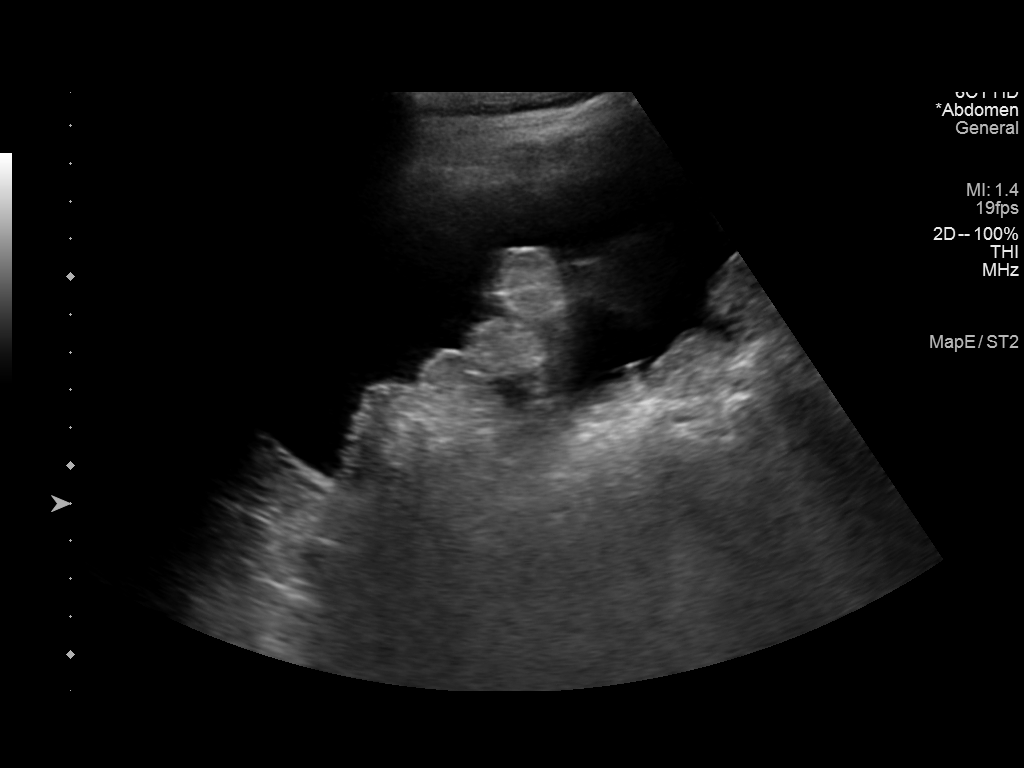
[im 2/5]
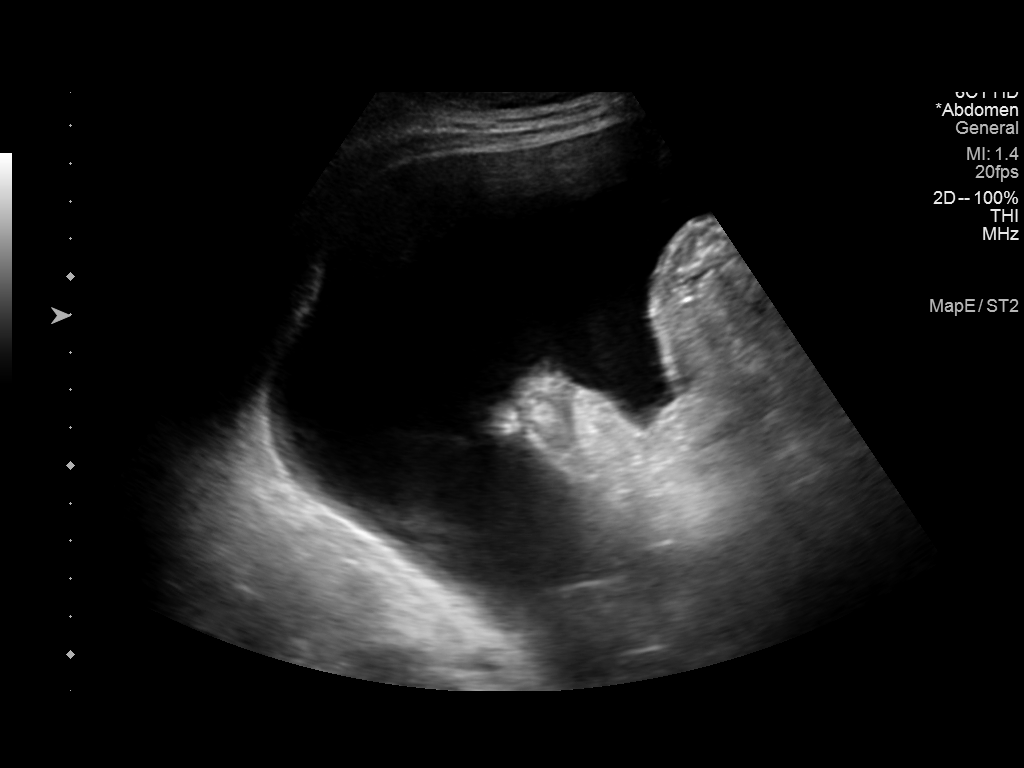
[im 3/5]
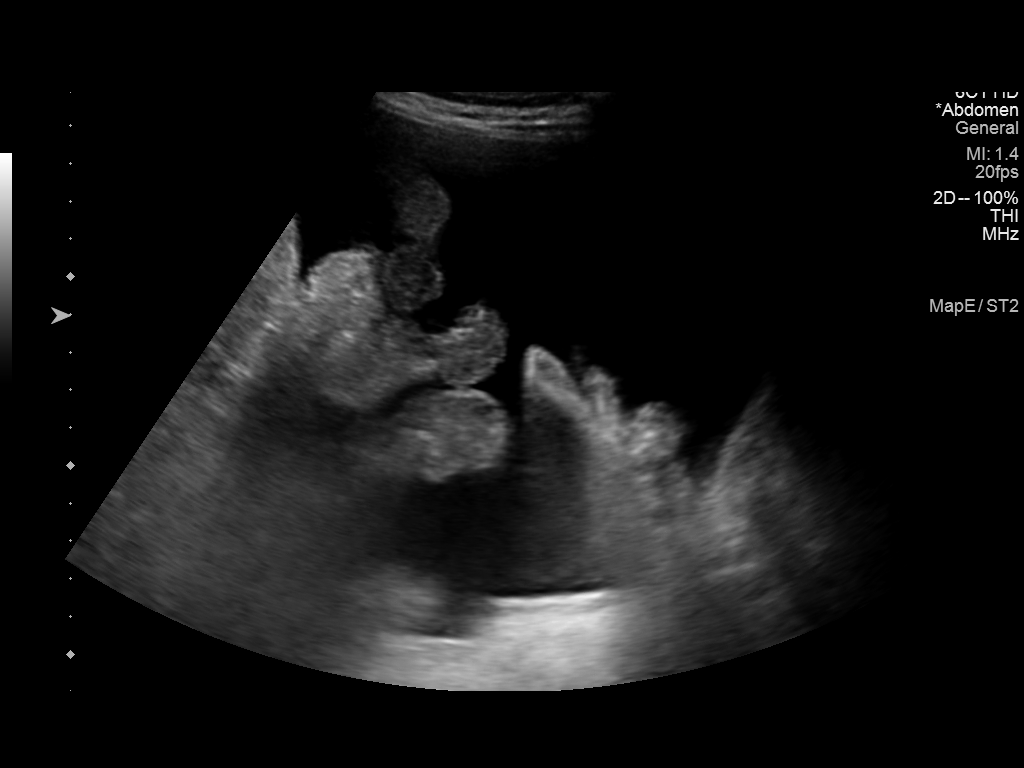
[im 4/5]
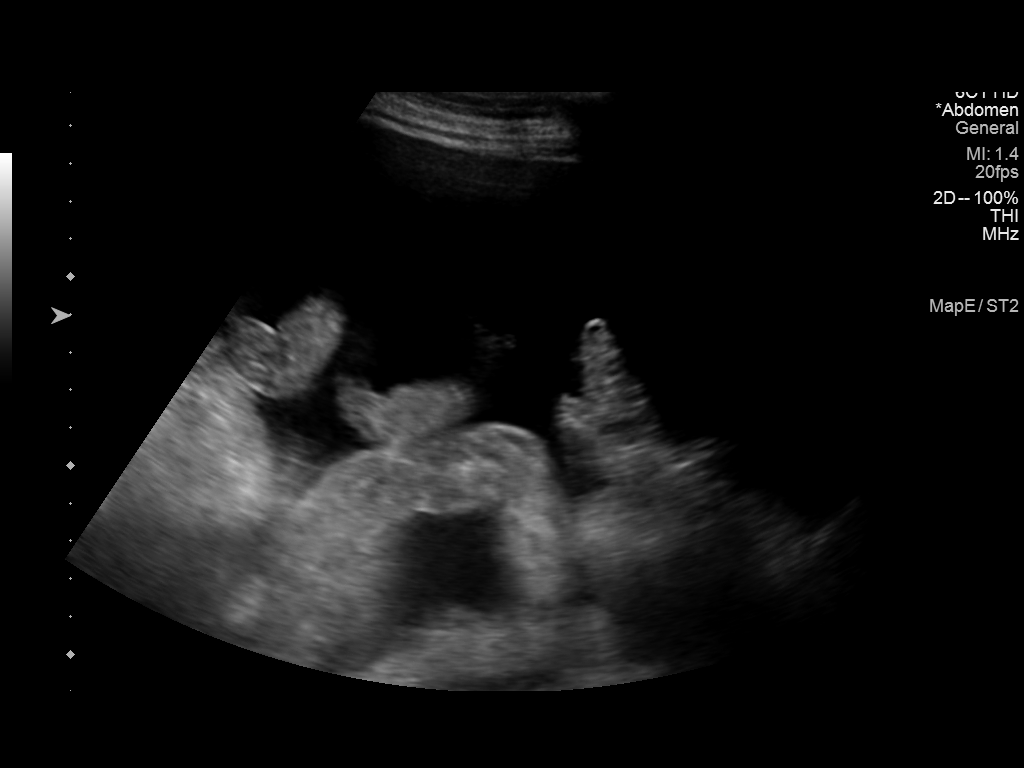
[im 5/5]
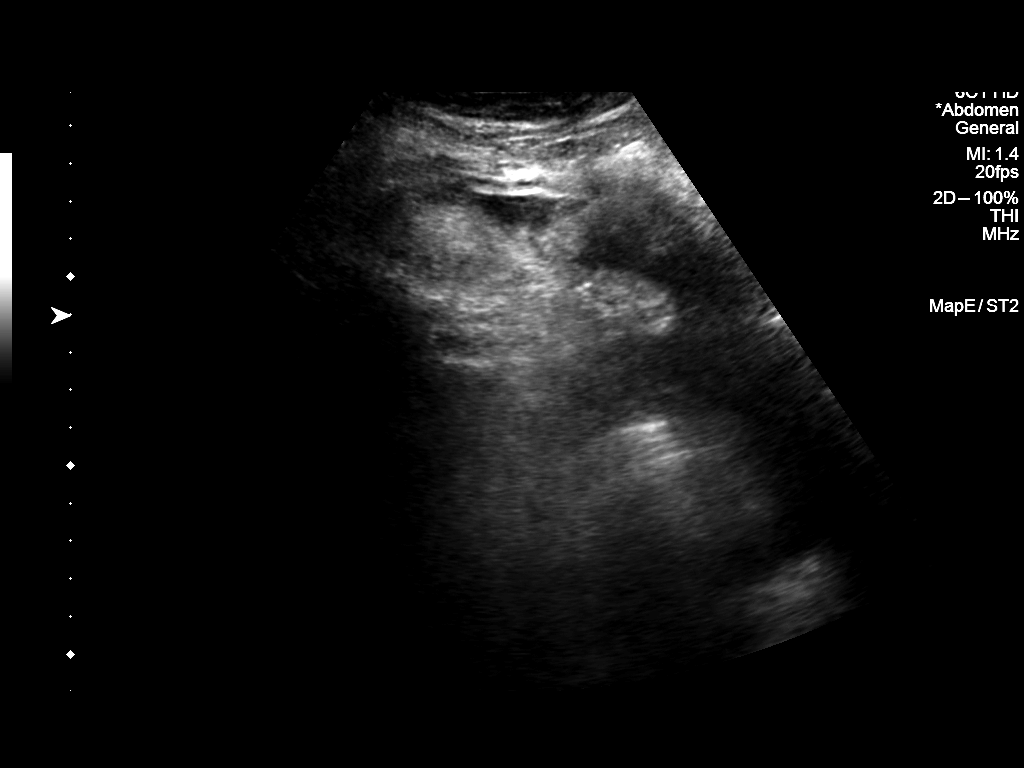

[5 of 5 positions shown; findings below may reference images not displayed]

EXAM:
ULTRASOUND GUIDED RIGHT LOWER QUADRANT PARACENTESIS

MEDICATIONS:
None.

COMPLICATIONS:
None immediate.

PROCEDURE:
Informed written consent was obtained from the patient after a
discussion of the risks, benefits and alternatives to treatment. A
timeout was performed prior to the initiation of the procedure.

Initial ultrasound scanning demonstrates a large amount of ascites
within the right lower abdominal quadrant. The right lower abdomen
was prepped and draped in the usual sterile fashion. 1% lidocaine
with epinephrine was used for local anesthesia.

Following this, a 7 cm, 19 gauge, Yueh catheter was introduced. An
ultrasound image was saved for documentation purposes. The
paracentesis was performed. The catheter was removed and a dressing
was applied. The patient tolerated the procedure well without
immediate post procedural complication.
FINDINGS: A total of approximately 6 L of hazy yellow fluid was removed.
IMPRESSION: Successful ultrasound-guided paracentesis yielding 6 liters of
peritoneal fluid.

## 2017-09-30 IMAGING — US US ABDOMEN COMPLETE
1 series · 14 of 25 positions shown · non-contrast
Comparison: CT of 05/14/2015

CLINICAL DATA: Pancreatic cancer with abdominal distention.

EXAM:
ABDOMEN ULTRASOUND COMPLETE

[Series 1: us abdomen complete · 0.21mm/px · 14 of 94 slices shown]
[im 1/94]
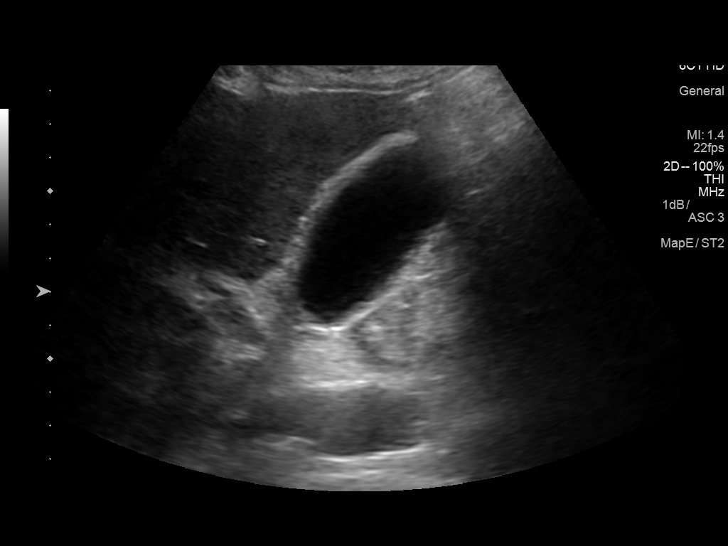
[im 8/94]
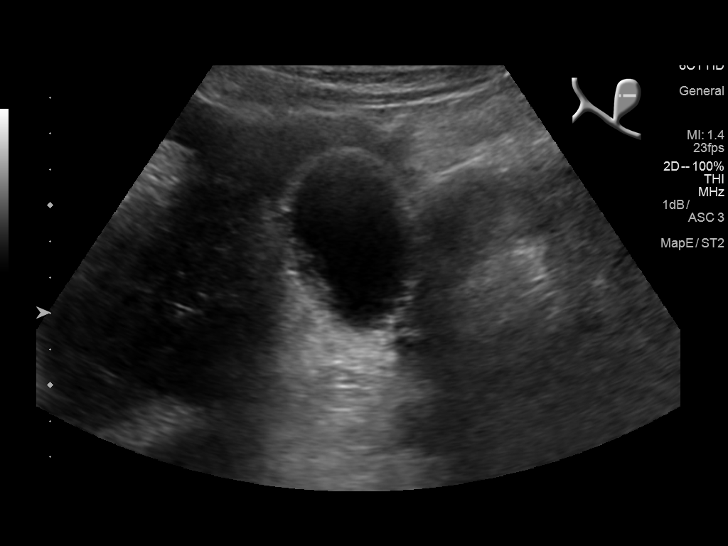
[im 16/94]
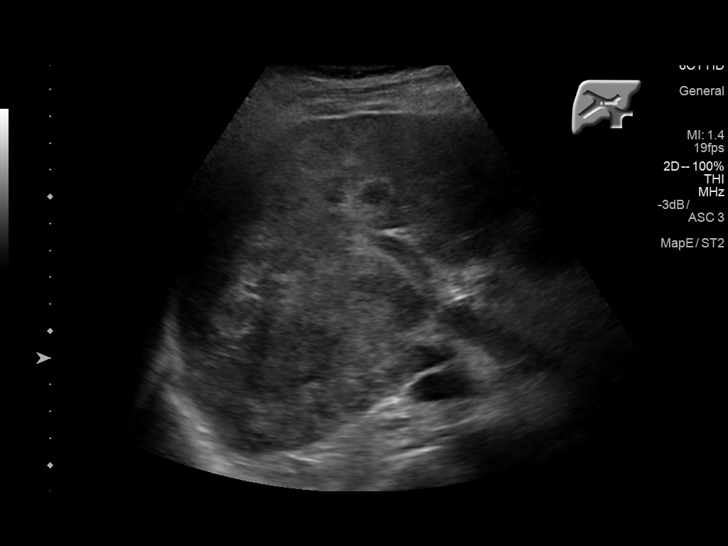
[im 24/94]
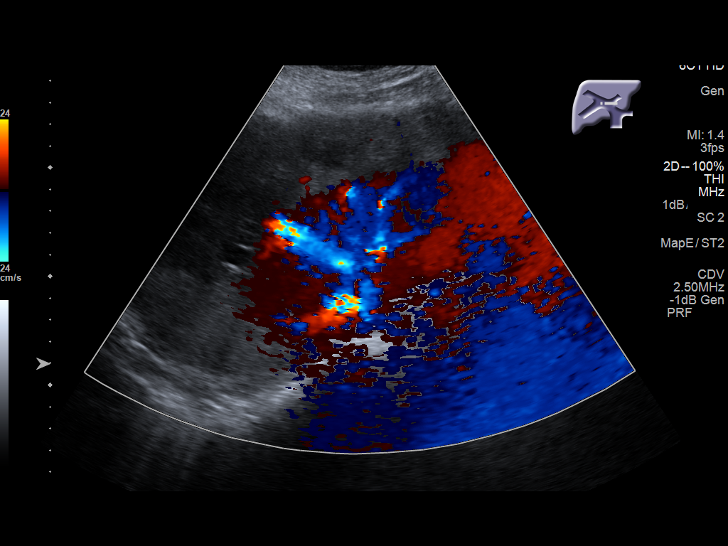
[im 32/94]
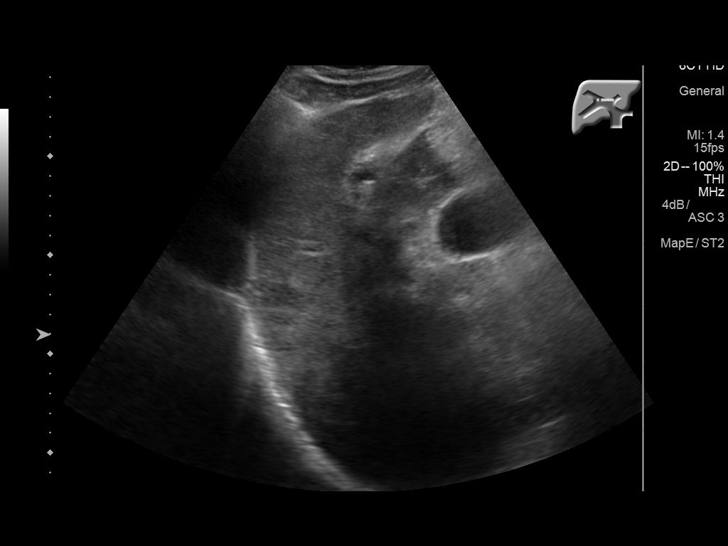
[im 35/94]
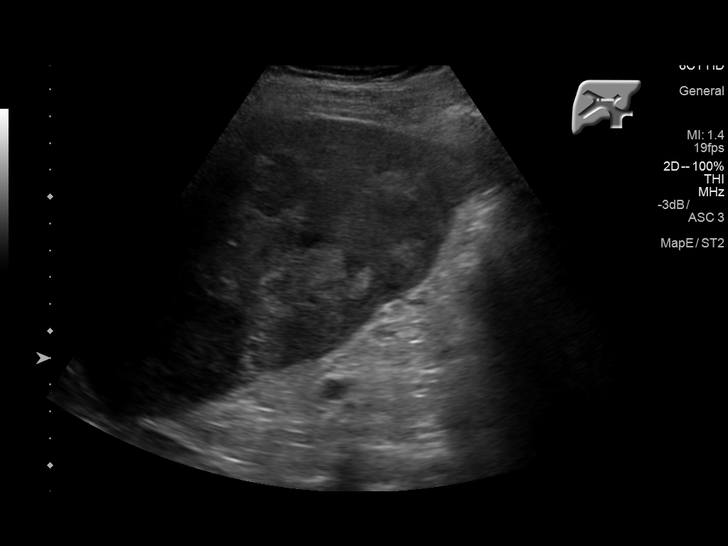
[im 43/94]
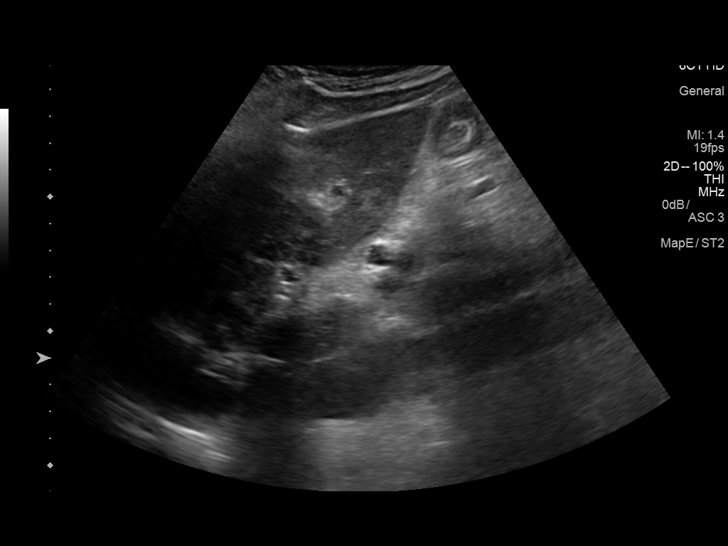
[im 51/94]
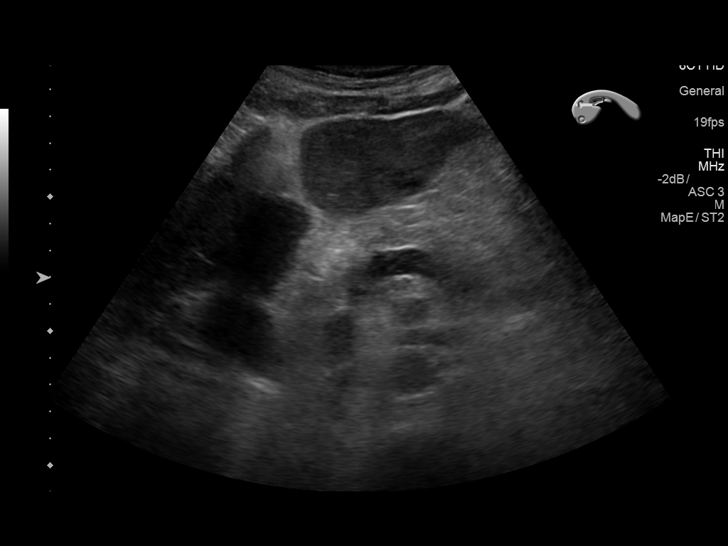
[im 59/94]
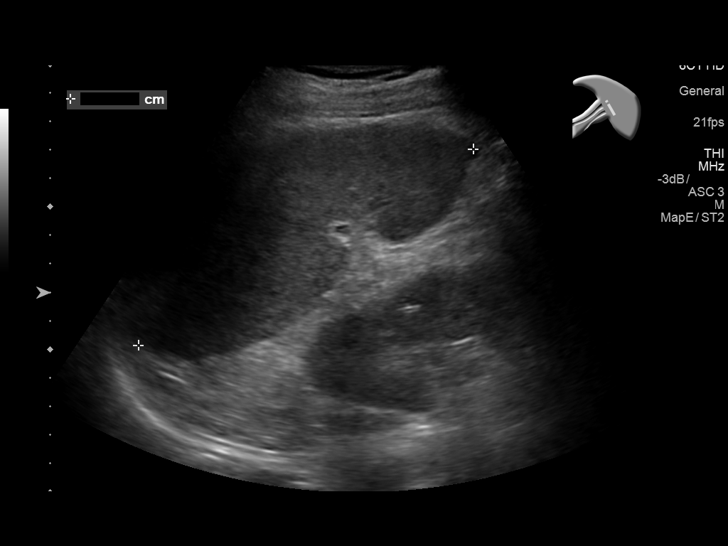
[im 63/94]
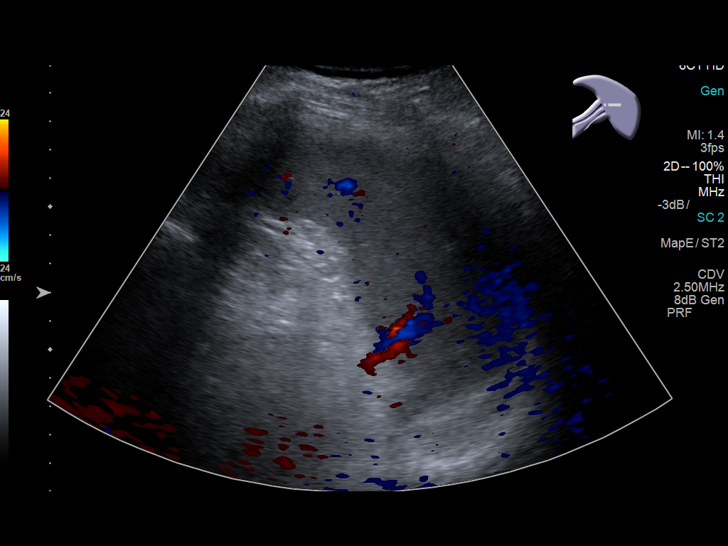
[im 70/94]
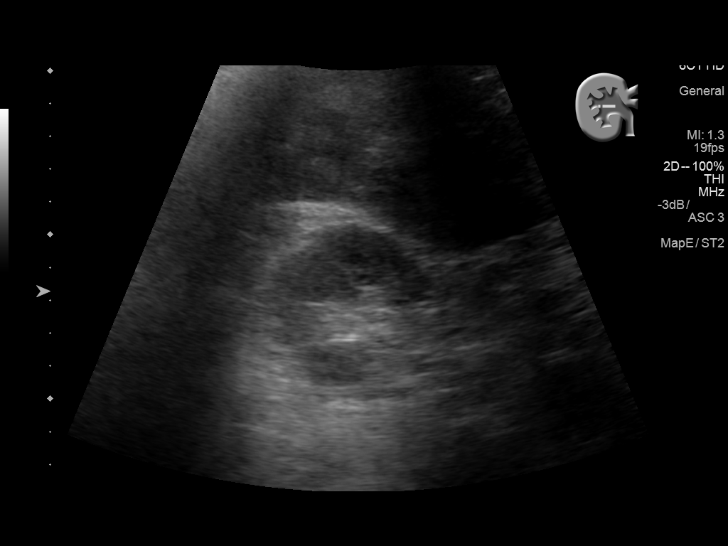
[im 78/94]
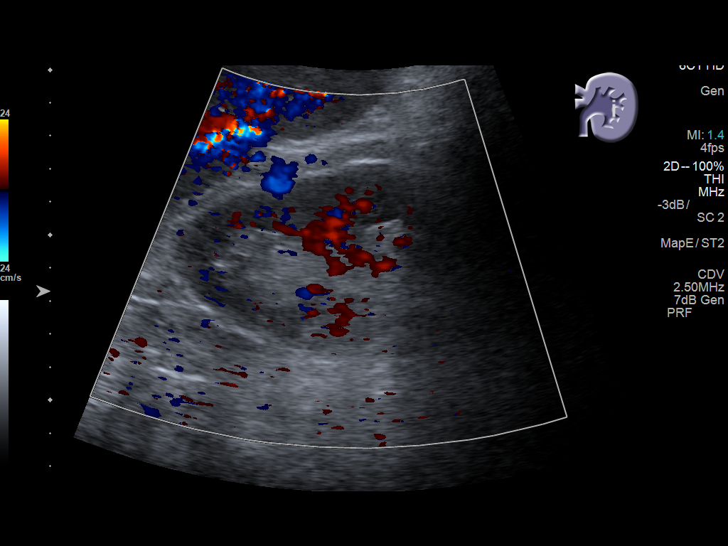
[im 86/94]
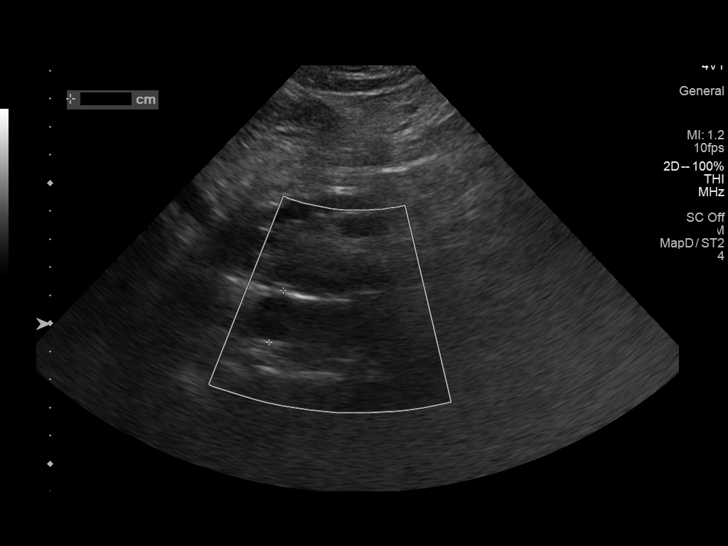
[im 94/94]
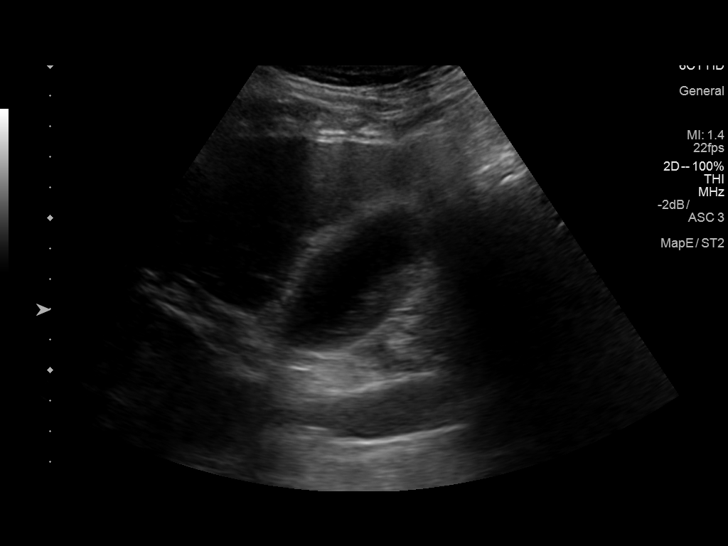

[14 of 25 positions shown; findings below may reference images not displayed]

FINDINGS: Gallbladder: Mild gallbladder sludge. No wall thickening or
pericholecystic fluid. Sonographic Murphy's sign was not elicited.

Common bile duct: Diameter: Normal, 4 mm.

Liver: Multiple hepatic metastasis, as on prior CT.

IVC: No abnormality visualized.

Pancreas: Suboptimally evaluated secondary to patient discomfort. No
gross pancreatic duct dilatation identified. Pancreatic uncinate
process primary is not well visualized.

Spleen: Size and appearance within normal limits.

Right Kidney: Length: 11.9 cm. Echogenicity within normal limits. No
mass or hydronephrosis visualized.

Left Kidney: Length: 10.3 cm. Echogenicity within normal limits. No
mass or hydronephrosis visualized.

Abdominal aorta: No aneurysm visualized.

Other findings: No significant ascites.
IMPRESSION: 1. No significant ascites identified.
2. Metastatic pancreatic cancer, as before.
3. Gallbladder sludge without acute cholecystitis.
